# Patient Record
Sex: Male | Born: 1980
Health system: Southern US, Community
[De-identification: ages and names within clinical notes are randomized; demographics above are authoritative.]

## PROBLEM LIST (undated history)

## (undated) ENCOUNTER — Ambulatory Visit: Admission: EM

## (undated) DIAGNOSIS — I10 Essential (primary) hypertension: Secondary | ICD-10-CM

## (undated) HISTORY — PX: EYE SURGERY: SHX253

## (undated) HISTORY — PX: FINGER SURGERY: SHX640

---
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
## 2002-03-23 HISTORY — PX: FINGER SURGERY: SHX640

## 2017-09-20 ENCOUNTER — Ambulatory Visit
Admission: EM | Admit: 2017-09-20 | Discharge: 2017-09-20 | Disposition: A | Discharge status: Home / Self Care | Payer: Managed Care, Other (non HMO) | Attending: Family Medicine | Admitting: Family Medicine

## 2017-09-20 ENCOUNTER — Other Ambulatory Visit: Payer: Self-pay

## 2017-09-20 DIAGNOSIS — S39012A Strain of muscle, fascia and tendon of lower back, initial encounter: Secondary | ICD-10-CM | POA: Diagnosis not present

## 2017-09-20 MED ORDER — CYCLOBENZAPRINE HCL 10 MG PO TABS: 10.0000 mg | ORAL_TABLET | Freq: Every day | ORAL | 0 refills | Status: DC

## 2017-09-20 NOTE — ED Triage Notes (Signed)
Patient complains of lower back pain that started on Friday. Patient states that he recently moved and thought it may be from that. Patient states that pain originally started in groin and moved to his back. Patient states that pain is worse with lifting or walking.

## 2017-09-20 NOTE — ED Provider Notes (Signed)
[facility]    CSN: [id number] Arrival date & time: 09/20/17  1341     History   Chief Complaint Chief Complaint  Patient presents with  . Back Pain    HPI Leonard Lee is a 37 y.o. male.   The history is provided by the patient.  Back Pain  Location:  Lumbar spine Quality:  Aching Radiates to:  Does not radiate Pain severity:  Moderate Pain is:  Same all the time Onset quality:  Sudden Duration:  4 days Timing:  Constant Progression:  Unchanged Chronicity:  New Context: lifting heavy objects   Relieved by:  Nothing Ineffective treatments:  OTC medications Associated symptoms: no abdominal pain, no abdominal swelling, no bladder incontinence, no bowel incontinence, no chest pain, no dysuria, no fever, no headaches, no leg pain, no numbness, no paresthesias, no pelvic pain, no perianal numbness, no tingling, no weakness and no weight loss   Risk factors: no hx of cancer, no hx of osteoporosis, no recent surgery, no steroid use and no vascular disease     History reviewed. No pertinent past medical history.  There are no active problems to display for this patient.   Past Surgical History:  Procedure Laterality Date  . FINGER SURGERY         Home Medications    Prior to Admission medications   Medication Sig Start Date End Date Taking? Authorizing Provider  lisinopril (PRINIVIL,ZESTRIL) 20 MG tablet Take 20 mg by mouth daily.   Yes [provider]  cyclobenzaprine (FLEXERIL) 10 MG tablet Take 1 tablet (10 mg total) by mouth at bedtime. 09/20/17   Payton Mccallum, MD    Family History Family History  Problem Relation Age of Onset  . Heart disease Father     Social History Social History   Tobacco Use  . Smoking status: Current Some Day Smoker  . Smokeless tobacco: Never Used  Substance Use Topics  . Alcohol use: Not Currently  . Drug use: Not Currently     Allergies   Patient has no known allergies.   Review of  Systems Review of Systems  Constitutional: Negative for fever and weight loss.  Cardiovascular: Negative for chest pain.  Gastrointestinal: Negative for abdominal pain and bowel incontinence.  Genitourinary: Negative for bladder incontinence, dysuria and pelvic pain.  Musculoskeletal: Positive for back pain.  Neurological: Negative for tingling, weakness, numbness, headaches and paresthesias.     Physical Exam Triage Vital Signs ED Triage Vitals  Enc Vitals Group     BP 09/20/17 1401 (!) 147/98     Pulse Rate 09/20/17 1401 60     Resp 09/20/17 1401 18     Temp 09/20/17 1401 98.9 F (37.2 C)     Temp Source 09/20/17 1401 Oral     SpO2 09/20/17 1401 100 %     Weight 09/20/17 1359 300 lb (136.1 kg)     Height 09/20/17 1359 6\' 3"  (1.905 m)     Head Circumference --      Peak Flow --      Pain Score 09/20/17 1359 10     Pain Loc --      Pain Edu? --      Excl. in [place]? --    No data found.  Updated Vital Signs BP (!) 147/98 (BP Location: Right Arm)   Pulse 60   Temp 98.9 F (37.2 C) (Oral)   Resp 18   Ht 6\' 3"  (1.905 m)   Wt 300 lb (  136.1 kg)   SpO2 100%   BMI 37.50 kg/m   Visual Acuity Right Eye Distance:   Left Eye Distance:   Bilateral Distance:    Right Eye Near:   Left Eye Near:    Bilateral Near:     Physical Exam  Constitutional: He appears well-developed and well-nourished. No distress.  Neck: Normal range of motion. Neck supple. No tracheal deviation present.  Pulmonary/Chest: Effort normal. No stridor. No respiratory distress.  Musculoskeletal:       Lumbar back: He exhibits tenderness and spasm. He exhibits normal range of motion, no bony tenderness, no swelling, no edema, no deformity, no laceration, no pain and normal pulse.  Neurological: He is alert. He has normal reflexes. He displays normal reflexes. He exhibits normal muscle tone. Coordination normal.  Skin: No rash noted. He is not diaphoretic.  Nursing note and vitals reviewed.    UC  Treatments / Results  Labs (all labs ordered are listed, but only abnormal results are displayed) Labs Reviewed - No data to display  EKG None  Radiology No results found.  Procedures Procedures (including critical care time)  Medications Ordered in UC Medications - No data to display  Initial Impression / Assessment and Plan / UC Course  I have reviewed the triage vital signs and the nursing notes.  Pertinent labs & imaging results that were available during my care of the patient were reviewed by me and considered in my medical decision making (see chart for details).      Final Clinical Impressions(s) / UC Diagnoses   Final diagnoses:  Strain of lumbar region, initial encounter    ED Prescriptions    Medication Sig Dispense Auth. Provider   cyclobenzaprine (FLEXERIL) 10 MG tablet Take 1 tablet (10 mg total) by mouth at bedtime. 30 tablet Payton Mccallumonty, Clearnce Leja, MD     1. diagnosis reviewed with patient 2. rx as per orders above; reviewed possible side effects, interactions, risks and benefits  3. Recommend supportive treatment with heat/ice, stretching, otc analgesics prn 4. Follow-up prn if symptoms worsen or don't improve   Controlled Substance Prescriptions [place] Controlled Substance Registry consulted? Not Applicable   Payton Mccallumonty, Rayner Erman, MD 09/20/17 [phone]

## 2017-09-30 ENCOUNTER — Other Ambulatory Visit: Payer: Self-pay

## 2017-09-30 ENCOUNTER — Ambulatory Visit
Admission: EM | Admit: 2017-09-30 | Discharge: 2017-09-30 | Disposition: A | Discharge status: Home / Self Care | Payer: Managed Care, Other (non HMO) | Attending: Family Medicine | Admitting: Family Medicine

## 2017-09-30 DIAGNOSIS — S0501XA Injury of conjunctiva and corneal abrasion without foreign body, right eye, initial encounter: Secondary | ICD-10-CM | POA: Diagnosis not present

## 2017-09-30 MED ORDER — CIPROFLOXACIN HCL 0.3 % OP SOLN: 1.0000 [drp] | Freq: Four times a day (QID) | OPHTHALMIC | 0 refills | Status: AC
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
## 2017-09-30 NOTE — Discharge Instructions (Addendum)
Eye drop as prescribed.  No contacts over the weekend.  Take care  Dr. Adriana Simasook

## 2018-09-13 ENCOUNTER — Encounter: Payer: Self-pay | Admitting: Emergency Medicine

## 2018-09-13 ENCOUNTER — Other Ambulatory Visit: Payer: Self-pay

## 2018-09-13 ENCOUNTER — Ambulatory Visit
Admission: EM | Admit: 2018-09-13 | Discharge: 2018-09-13 | Disposition: A | Discharge status: Home / Self Care | Payer: 59 | Attending: Family Medicine | Admitting: Family Medicine

## 2018-09-13 DIAGNOSIS — S39012A Strain of muscle, fascia and tendon of lower back, initial encounter: Secondary | ICD-10-CM | POA: Diagnosis not present

## 2018-09-13 HISTORY — DX: Essential (primary) hypertension: I10

## 2018-09-13 MED ORDER — MELOXICAM 15 MG PO TABS: 15.0000 mg | ORAL_TABLET | Freq: Every day | ORAL | 0 refills | Status: DC | PRN

## 2018-09-13 MED ORDER — METAXALONE 800 MG PO TABS: 800.0000 mg | ORAL_TABLET | Freq: Three times a day (TID) | ORAL | 0 refills | Status: DC | PRN

## 2018-09-13 NOTE — ED Triage Notes (Signed)
Pt c/o lower back pain. Started yesterday and worsening. He states that he bent over to get something off the floor and heard a pop.

## 2018-09-13 NOTE — ED Provider Notes (Signed)
[facility]    CSN: [id number] Arrival date & time: 09/13/18  1646   History   Chief Complaint Chief Complaint  Patient presents with  . Back Pain   HPI  38 year old male presents with low back pain.  Started yesterday.  Patient reports that he bent over to pick something up and felt a pop in his low back.  He reports right low back pain.  States that it is severe.  Currently 7/10 in severity.  Worse with activity.  No relieving factors.  Patient states that he has missed work as a result.  Patient requesting a work note today.  No other reported symptoms.  No other complaints.  PMH, Surgical Hx, Family Hx, Social History reviewed and updated as below.  Past Medical History:  Diagnosis Date  . Hypertension   Morbid obesity  Past Surgical History:  Procedure Laterality Date  . FINGER SURGERY     Home Medications    Prior to Admission medications   Medication Sig Start Date End Date Taking? Authorizing Provider  lisinopril (PRINIVIL,ZESTRIL) 20 MG tablet Take 20 mg by mouth daily.   Yes [provider]  meloxicam (MOBIC) 15 MG tablet Take 1 tablet (15 mg total) by mouth daily as needed for pain. 09/13/18   Coral Spikes, DO  metaxalone (SKELAXIN) 800 MG tablet Take 1 tablet (800 mg total) by mouth 3 (three) times daily as needed for muscle spasms. 09/13/18   Coral Spikes, DO    Family History Family History  Problem Relation Age of Onset  . Heart disease Father     Social History Social History   Tobacco Use  . Smoking status: Current Some Day Smoker  . Smokeless tobacco: Never Used  Substance Use Topics  . Alcohol use: Not Currently  . Drug use: Not Currently     Allergies   Patient has no known allergies.   Review of Systems Review of Systems  Constitutional: Negative.   Musculoskeletal: Positive for back pain.   Physical Exam Triage Vital Signs ED Triage Vitals  Enc Vitals Group     BP 09/13/18 1700 (!) 157/100     Pulse Rate  09/13/18 1700 71     Resp 09/13/18 1700 18     Temp 09/13/18 1700 98.9 F (37.2 C)     Temp Source 09/13/18 1700 Oral     SpO2 09/13/18 1700 99 %     Weight 09/13/18 1657 (!) 340 lb (154.2 kg)     Height 09/13/18 1657 6\' 3"  (1.905 m)     Head Circumference --      Peak Flow --      Pain Score 09/13/18 1657 7     Pain Loc --      Pain Edu? --      Excl. in [place]? --    Updated Vital Signs BP (!) 157/100 (BP Location: Left Arm)   Pulse 71   Temp 98.9 F (37.2 C) (Oral)   Resp 18   Ht 6\' 3"  (1.905 m)   Wt (!) 154.2 kg   SpO2 99%   BMI 42.50 kg/m   Visual Acuity Right Eye Distance:   Left Eye Distance:   Bilateral Distance:    Right Eye Near:   Left Eye Near:    Bilateral Near:     Physical Exam Vitals signs and nursing note reviewed.  Constitutional:      General: He is not in acute distress.    Appearance:  Normal appearance.  HENT:     Head: Normocephalic and atraumatic.  Eyes:     General:        Right eye: No discharge.        Left eye: No discharge.     Conjunctiva/sclera: Conjunctivae normal.  Cardiovascular:     Rate and Rhythm: Normal rate and regular rhythm.  Pulmonary:     Effort: Pulmonary effort is normal.     Breath sounds: Normal breath sounds.  Musculoskeletal:     Comments: Low back - decreased ROM due to pain. Negative straight leg raise.  Neurological:     Mental Status: He is alert.  Psychiatric:        Mood and Affect: Mood normal.        Behavior: Behavior normal.    UC Treatments / Results  Labs (all labs ordered are listed, but only abnormal results are displayed) Labs Reviewed - No data to display  EKG None  Radiology No results found.  Procedures Procedures (including critical care time)  Medications Ordered in UC Medications - No data to display  Initial Impression / Assessment and Plan / UC Course  I have reviewed the triage vital signs and the nursing notes.  Pertinent labs & imaging results that were available  during my care of the patient were reviewed by me and considered in my medical decision making (see chart for details).    38 year old male presents with a lumbar strain.  Meloxicam and Skelaxin as directed.  Work note given.  Final Clinical Impressions(s) / UC Diagnoses   Final diagnoses:  Strain of lumbar region, initial encounter     Discharge Instructions     Rest. Ice and heat as needed.  Medications as prescribed.  Take care  Dr. Adriana Simasook    ED Prescriptions    Medication Sig Dispense Auth. Provider   meloxicam (MOBIC) 15 MG tablet Take 1 tablet (15 mg total) by mouth daily as needed for pain. 30 tablet Tiesha Marich G, DO   metaxalone (SKELAXIN) 800 MG tablet Take 1 tablet (800 mg total) by mouth 3 (three) times daily as needed for muscle spasms. 30 tablet Tommie Samsook, Lynnsie Linders G, DO     Controlled Substance Prescriptions [place] Controlled Substance Registry consulted? Not Applicable   Tommie SamsCook, Alina Gilkey G, DO 09/13/18 1735

## 2018-09-13 NOTE — Discharge Instructions (Signed)
Rest. Ice and heat as needed.  Medications as prescribed.  Take care  Dr. Lacinda Axon

## 2018-12-05 ENCOUNTER — Encounter: Payer: Self-pay | Admitting: Emergency Medicine

## 2018-12-05 ENCOUNTER — Other Ambulatory Visit: Payer: Self-pay

## 2018-12-05 ENCOUNTER — Ambulatory Visit
Admission: EM | Admit: 2018-12-05 | Discharge: 2018-12-05 | Disposition: A | Discharge status: Home / Self Care | Payer: 59 | Attending: Family Medicine | Admitting: Family Medicine

## 2018-12-05 DIAGNOSIS — H6501 Acute serous otitis media, right ear: Secondary | ICD-10-CM | POA: Diagnosis not present

## 2018-12-05 DIAGNOSIS — J029 Acute pharyngitis, unspecified: Secondary | ICD-10-CM

## 2018-12-05 MED ORDER — AMOXICILLIN 875 MG PO TABS: 875.0000 mg | ORAL_TABLET | Freq: Two times a day (BID) | ORAL | 0 refills | Status: DC

## 2018-12-05 NOTE — Discharge Instructions (Addendum)
Tylenol, advil as needed Salt water gargles

## 2018-12-05 NOTE — ED Provider Notes (Signed)
[facility]    CSN: [id number] Arrival date & time: 12/05/18  1604      History   Chief Complaint Chief Complaint  Patient presents with  . Otalgia    HPI Leonard Lee is a 38 y.o. male.   38 yo male with a c/o right ear pain, pressure and intermittent ringing since yesterday along with sore throat. Denies any congestion, cough, fevers, chills, drainage.    Otalgia   Past Medical History:  Diagnosis Date  . Hypertension     There are no active problems to display for this patient.   Past Surgical History:  Procedure Laterality Date  . FINGER SURGERY         Home Medications    Prior to Admission medications   Medication Sig Start Date End Date Taking? Authorizing Provider  lisinopril (PRINIVIL,ZESTRIL) 20 MG tablet Take 20 mg by mouth daily.   Yes [provider]  amoxicillin (AMOXIL) 875 MG tablet Take 1 tablet (875 mg total) by mouth 2 (two) times daily. 12/05/18   Norval Gable, MD  meloxicam (MOBIC) 15 MG tablet Take 1 tablet (15 mg total) by mouth daily as needed for pain. 09/13/18   Coral Spikes, DO  metaxalone (SKELAXIN) 800 MG tablet Take 1 tablet (800 mg total) by mouth 3 (three) times daily as needed for muscle spasms. 09/13/18   Coral Spikes, DO    Family History Family History  Problem Relation Age of Onset  . Heart disease Father     Social History Social History   Tobacco Use  . Smoking status: Current Some Day Smoker  . Smokeless tobacco: Never Used  Substance Use Topics  . Alcohol use: Not Currently  . Drug use: Not Currently     Allergies   Patient has no known allergies.   Review of Systems Review of Systems  HENT: Positive for ear pain.      Physical Exam Triage Vital Signs ED Triage Vitals  Enc Vitals Group     BP 12/05/18 1621 (!) 176/113     Pulse Rate 12/05/18 1621 91     Resp 12/05/18 1621 19     Temp 12/05/18 1621 99.1 F (37.3 C)     Temp Source 12/05/18 1621 Oral     SpO2 12/05/18  1621 98 %     Weight 12/05/18 1623 (!) 330 lb (149.7 kg)     Height 12/05/18 1623 6\' 3"  (1.905 m)     Head Circumference --      Peak Flow --      Pain Score 12/05/18 1623 7     Pain Loc --      Pain Edu? --      Excl. in [place]? --    No data found.  Updated Vital Signs BP (!) 176/113 (BP Location: Left Arm)   Pulse 91   Temp 99.1 F (37.3 C) (Oral)   Resp 19   Ht 6\' 3"  (1.905 m)   Wt (!) 149.7 kg   SpO2 98%   BMI 41.25 kg/m   Visual Acuity Right Eye Distance:   Left Eye Distance:   Bilateral Distance:    Right Eye Near:   Left Eye Near:    Bilateral Near:     Physical Exam Vitals signs and nursing note reviewed.  Constitutional:      General: He is not in acute distress.    Appearance: He is not toxic-appearing or diaphoretic.  HENT:  Right Ear: A middle ear effusion is present. Tympanic membrane is erythematous and bulging.     Left Ear: Tympanic membrane normal.     Nose: Nose normal.     Mouth/Throat:     Pharynx: Uvula midline. Posterior oropharyngeal erythema present. No oropharyngeal exudate or uvula swelling.     Tonsils: No tonsillar exudate or tonsillar abscesses.  Cardiovascular:     Rate and Rhythm: Normal rate.  Neurological:     Mental Status: He is alert.      UC Treatments / Results  Labs (all labs ordered are listed, but only abnormal results are displayed) Labs Reviewed - No data to display  EKG   Radiology No results found.  Procedures Procedures (including critical care time)  Medications Ordered in UC Medications - No data to display  Initial Impression / Assessment and Plan / UC Course  I have reviewed the triage vital signs and the nursing notes.  Pertinent labs & imaging results that were available during my care of the patient were reviewed by me and considered in my medical decision making (see chart for details).      Final Clinical Impressions(s) / UC Diagnoses   Final diagnoses:  Right acute serous otitis  media, recurrence not specified  Sore throat     Discharge Instructions     Tylenol, advil as needed Salt water gargles    ED Prescriptions    Medication Sig Dispense Auth. Provider   amoxicillin (AMOXIL) 875 MG tablet Take 1 tablet (875 mg total) by mouth 2 (two) times daily. 20 tablet Payton Mccallumonty, Laberta Wilbon, MD      1. diagnosis reviewed with patient 2. rx as per orders above; reviewed possible side effects, interactions, risks and benefits  3. Recommend supportive treatment as above 4. Follow-up prn if symptoms worsen or don't improve   Controlled Substance Prescriptions [place] Controlled Substance Registry consulted? Not Applicable   Payton Mccallumonty, Jaxton Casale, MD 12/05/18 1820

## 2018-12-05 NOTE — ED Triage Notes (Signed)
Patient states he developed a ringing and pain in his right ear

## 2019-03-07 ENCOUNTER — Ambulatory Visit
Admission: EM | Admit: 2019-03-07 | Discharge: 2019-03-07 | Disposition: A | Discharge status: Home / Self Care | Payer: 59 | Attending: Family Medicine | Admitting: Family Medicine

## 2019-03-07 ENCOUNTER — Encounter: Payer: Self-pay | Admitting: Emergency Medicine

## 2019-03-07 ENCOUNTER — Other Ambulatory Visit: Payer: Self-pay

## 2019-03-07 DIAGNOSIS — R197 Diarrhea, unspecified: Secondary | ICD-10-CM

## 2019-03-07 MED ORDER — ONDANSETRON HCL 4 MG PO TABS: 4.0000 mg | ORAL_TABLET | Freq: Three times a day (TID) | ORAL | 0 refills | Status: DC | PRN

## 2019-03-07 MED ORDER — DIPHENOXYLATE-ATROPINE 2.5-0.025 MG PO TABS: 1.0000 | ORAL_TABLET | Freq: Four times a day (QID) | ORAL | 0 refills | Status: DC | PRN

## 2019-03-07 NOTE — ED Triage Notes (Addendum)
Patient c/o diarrhea that started 5 days ago. He states he started taking Pepto Bismol 2 days ago which then caused abdominal pain. He states he vomited x 1 5 days ago.  Patient requiring COVID testing per work.

## 2019-03-07 NOTE — ED Provider Notes (Signed)
[facility]    CSN: [id number] Arrival date & time: 03/07/19  1114  History   Chief Complaint Chief Complaint  Patient presents with  . Diarrhea  . Abdominal Pain   HPI  38 year old male presents with diarrhea, abdominal pain, nausea.  Patient reports his symptoms started on Friday.  He reports ongoing diarrhea.  Has now developed sharp abdominal pains.  Had one episode of emesis.  Has not had any since.  Reports ongoing nausea.  He is tolerating fluids.  He is tolerating some food.  He is taken some Pepto-Bismol without resolution.  No fever.  No urinary symptoms.  Patient states that he notified his employer and he was encouraged to come in for evaluation and to get a Covid test.  No other associated symptoms.  No other complaints or concerns at this time.  PMH, Surgical Hx, Family Hx, Social History reviewed and updated as below. Past Medical History:  Diagnosis Date  . Hypertension   Morbid obesity  Past Surgical History:  Procedure Laterality Date  . FINGER SURGERY      Home Medications    Prior to Admission medications   Medication Sig Start Date End Date Taking? Authorizing Provider  lisinopril (PRINIVIL,ZESTRIL) 20 MG tablet Take 20 mg by mouth daily.   Yes [provider]  lisinopril-hydrochlorothiazide (ZESTORETIC) 20-12.5 MG tablet lisinopril 20 mg-hydrochlorothiazide 12.5 mg tablet   Yes [provider]  amoxicillin (AMOXIL) 875 MG tablet Take 1 tablet (875 mg total) by mouth 2 (two) times daily. 12/05/18   Norval Gable, MD  diphenoxylate-atropine (LOMOTIL) 2.5-0.025 MG tablet Take 1 tablet by mouth 4 (four) times daily as needed for diarrhea or loose stools. 03/07/19   Coral Spikes, DO  ondansetron (ZOFRAN) 4 MG tablet Take 1 tablet (4 mg total) by mouth every 8 (eight) hours as needed for nausea or vomiting. 03/07/19   Coral Spikes, DO    Family History Family History  Problem Relation Age of Onset  . Heart disease Father      Social History Social History   Tobacco Use  . Smoking status: Current Some Day Smoker  . Smokeless tobacco: Never Used  Substance Use Topics  . Alcohol use: Never  . Drug use: Not Currently     Allergies   Patient has no known allergies.   Review of Systems Review of Systems  Constitutional: Positive for appetite change. Negative for fever.  Gastrointestinal: Positive for diarrhea, nausea and vomiting.   Physical Exam Triage Vital Signs ED Triage Vitals  Enc Vitals Group     BP 03/07/19 1133 (!) 161/76     Pulse Rate 03/07/19 1133 77     Resp 03/07/19 1133 18     Temp 03/07/19 1133 98.6 F (37 C)     Temp Source 03/07/19 1133 Oral     SpO2 03/07/19 1133 99 %     Weight 03/07/19 1128 (!) 340 lb (154.2 kg)     Height 03/07/19 1128 6\' 3"  (1.905 m)     Head Circumference --      Peak Flow --      Pain Score 03/07/19 1128 5     Pain Loc --      Pain Edu? --      Excl. in [place]? --    Updated Vital Signs BP (!) 161/76 (BP Location: Right Arm)   Pulse 77   Temp 98.6 F (37 C) (Oral)   Resp 18   Ht  6\' 3"  (1.905 m)   Wt (!) 154.2 kg   SpO2 99%   BMI 42.50 kg/m   Visual Acuity Right Eye Distance:   Left Eye Distance:   Bilateral Distance:    Right Eye Near:   Left Eye Near:    Bilateral Near:     Physical Exam Vitals and nursing note reviewed.  Constitutional:      General: He is not in acute distress.    Appearance: Normal appearance. He is obese. He is not ill-appearing.  HENT:     Head: Normocephalic and atraumatic.  Eyes:     General:        Right eye: No discharge.        Left eye: No discharge.     Conjunctiva/sclera: Conjunctivae normal.  Cardiovascular:     Rate and Rhythm: Normal rate and regular rhythm.     Heart sounds: No murmur.  Pulmonary:     Effort: Pulmonary effort is normal.     Breath sounds: Normal breath sounds. No wheezing, rhonchi or rales.  Abdominal:     General: There is no distension.     Palpations: Abdomen is  soft.     Tenderness: There is no abdominal tenderness.  Neurological:     Mental Status: He is alert.  Psychiatric:        Mood and Affect: Mood normal.        Behavior: Behavior normal.    UC Treatments / Results  Labs (all labs ordered are listed, but only abnormal results are displayed) Labs Reviewed  NOVEL CORONAVIRUS, NAA (HOSP ORDER, SEND-OUT TO REF LAB; TAT 18-24 HRS)    EKG   Radiology No results found.  Procedures Procedures (including critical care time)  Medications Ordered in UC Medications - No data to display  Initial Impression / Assessment and Plan / UC Course  I have reviewed the triage vital signs and the nursing notes.  Pertinent labs & imaging results that were available during my care of the patient were reviewed by me and considered in my medical decision making (see chart for details).    38 year old male presents with nausea and diarrhea.  No recent vomiting.  Is well-appearing on exam.  Awaiting Covid test results.  Zofran and Lomotil as prescribed.  Final Clinical Impressions(s) / UC Diagnoses   Final diagnoses:  Diarrhea, unspecified type     Discharge Instructions     Fluids.  Medications as directed.  Take care  Dr. 24    ED Prescriptions    Medication Sig Dispense Auth. Provider   ondansetron (ZOFRAN) 4 MG tablet Take 1 tablet (4 mg total) by mouth every 8 (eight) hours as needed for nausea or vomiting. 20 tablet Crystalynn Mcinerney G, DO   diphenoxylate-atropine (LOMOTIL) 2.5-0.025 MG tablet Take 1 tablet by mouth 4 (four) times daily as needed for diarrhea or loose stools. 30 tablet 06-[birth year]-1989, DO     PDMP not reviewed this encounter.   Tommie Sams, DO 03/07/19 1324

## 2019-03-07 NOTE — Discharge Instructions (Signed)
Fluids.  Medications as directed.  Take care  Dr. Lacinda Axon

## 2019-03-08 LAB — NOVEL CORONAVIRUS, NAA (HOSP ORDER, SEND-OUT TO REF LAB; TAT 18-24 HRS): SARS-CoV-2, NAA: NOT DETECTED

## 2019-04-17 ENCOUNTER — Ambulatory Visit
Admission: EM | Admit: 2019-04-17 | Discharge: 2019-04-17 | Disposition: A | Discharge status: Home / Self Care | Payer: 59 | Attending: Family Medicine | Admitting: Family Medicine

## 2019-04-17 ENCOUNTER — Other Ambulatory Visit: Payer: Self-pay

## 2019-04-17 DIAGNOSIS — Z7189 Other specified counseling: Secondary | ICD-10-CM | POA: Diagnosis present

## 2019-04-17 DIAGNOSIS — R0981 Nasal congestion: Secondary | ICD-10-CM | POA: Insufficient documentation

## 2019-04-17 DIAGNOSIS — R519 Headache, unspecified: Secondary | ICD-10-CM | POA: Diagnosis not present

## 2019-04-17 DIAGNOSIS — Z20822 Contact with and (suspected) exposure to covid-19: Secondary | ICD-10-CM | POA: Diagnosis present

## 2019-04-17 NOTE — ED Provider Notes (Signed)
[facility] ____________________________________________  Time seen: Approximately 5:28 PM  I have reviewed the triage vital signs and the nursing notes.   HISTORY  Chief Complaint covid exposure   HPI Leonard Lee is a 39 y.o. male presenting for evaluation of nasal congestion and headache. Reports congestion and headache started today. 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  States he was last around these coworkers this past Friday.  States he overall feels well, but reports he is trying to be proactive as his wife is a diabetic.  Denies sore throat, chest pain, shortness of breath, vision changes, fevers, changes in taste or smell, vomiting or diarrhea.  Has not taken any of the counter medication today for same complaints.  Has continued to eat and drink well.  Denies other aggravating alleviating factors.  Patient reports his blood pressure is normally elevated in the doctor's office, has been taking his medication as prescribed.   Past Medical History:  Diagnosis Date  . Hypertension     There are no problems to display for this patient.   Past Surgical History:  Procedure Laterality Date  . EYE SURGERY    . FINGER SURGERY       No current facility-administered medications for this encounter.  Current Outpatient Medications:  .  lisinopril-hydrochlorothiazide (ZESTORETIC) 20-12.5 MG tablet, lisinopril 20 mg-hydrochlorothiazide 12.5 mg tablet, Disp: , Rfl:   Allergies Patient has no known allergies.  Family History  Problem Relation Age of Onset  . Heart disease Father     Social History Social History   Tobacco Use  . Smoking status: Current Some Day Smoker    Types: Cigarettes  . Smokeless tobacco: Never Used  Substance Use Topics  . Alcohol use: Never  . Drug use: Not Currently    Review of Systems Constitutional: No fever Eyes: No visual  changes. ENT: No sore throat.  Positive congestion. Cardiovascular: Denies chest pain. Respiratory: Denies shortness of breath. Gastrointestinal: No abdominal pain.  No nausea, no vomiting.  No diarrhea.  Genitourinary: Negative for dysuria. Musculoskeletal: Negative for back pain. Skin: Negative for rash. Neurological: Negative for numbness.   ____________________________________________   PHYSICAL EXAM:  VITAL SIGNS: ED Triage Vitals  Enc Vitals Group     BP 04/17/19 1648 (!) 175/114     Pulse Rate 04/17/19 1648 95     Resp 04/17/19 1648 20     Temp 04/17/19 1648 98.4 F (36.9 C)     Temp Source 04/17/19 1648 Oral     SpO2 04/17/19 1648 99 %     Weight 04/17/19 1651 (!) 340 lb (154.2 kg)     Height 04/17/19 1651 6\' 3"  (1.905 m)     Head Circumference --      Peak Flow --      Pain Score 04/17/19 1651 3     Pain Loc --      Pain Edu? --      Excl. in [place]? --    Vitals:   04/17/19 1648 04/17/19 1651 04/17/19 1712  BP: (!) 175/114  (!) 157/109  Pulse: 95    Resp: 20    Temp: 98.4 F (36.9 C)    TempSrc: Oral    SpO2: 99%    Weight:  (!) 340 lb (154.2 kg)   Height:  6\' 3"  (1.905 m)      Constitutional: Alert and oriented. Well appearing and in  no acute distress. Eyes: Conjunctivae are normal. PERRL. EOMI. ENT      Head: Normocephalic and atraumatic. Hematological/Lymphatic/Immunilogical: No cervical lymphadenopathy. Cardiovascular: Normal rate, regular rhythm. Grossly normal heart sounds.  Good peripheral circulation. Respiratory: Normal respiratory effort without tachypnea nor retractions. Breath sounds are clear and equal bilaterally. No wheezes, rales, rhonchi. Musculoskeletal: Steady gait.  No lower extremity edema noted bilaterally. Neurologic:  Normal speech and language. No gross focal neurologic deficits are appreciated. Speech is normal. No gait instability.  Skin:  Skin is warm, dry and intact. No rash noted. Psychiatric: Mood and affect are normal.  Speech and behavior are normal. Patient exhibits appropriate insight and judgment   ___________________________________________   LABS (all labs ordered are listed, but only abnormal results are displayed)  Labs Reviewed  NOVEL CORONAVIRUS, NAA (HOSP ORDER, SEND-OUT TO REF LAB; TAT 18-24 HRS)   ____________________________________________   PROCEDURES   INITIAL IMPRESSION / ASSESSMENT AND PLAN / ED COURSE  Pertinent labs & imaging results that were available during my care of the patient were reviewed by me and considered in my medical decision making (see chart for details).  Well-appearing patient.  No acute distress.  Suspect viral illness.  COVID-19 exposure.  COVID-19 testing completed and advice given.  Monitor rest, fluids, work note given.  Continue to monitor blood pressure, and patient verbalized understanding, and follow-up with his primary care.  Discussed indication, risks and benefits of medications with patient.  Discussed follow up and return parameters including no resolution or any worsening concerns. Patient verbalized understanding and agreed to plan.   ____________________________________________   FINAL CLINICAL IMPRESSION(S) / ED DIAGNOSES  Final diagnoses:  Advice given about COVID-19 virus infection  Exposure to COVID-19 virus  Nasal congestion     ED Discharge Orders    None       Note: This dictation was prepared with Dragon dictation along with smaller phrase technology. Any transcriptional errors that result from this process are unintentional.         Marylene Land, NP 04/17/19 1739

## 2019-04-17 NOTE — ED Triage Notes (Signed)
Pt states he was exposed to COVID at his job last week with two people there testing positive for COVID over the weekend. Today has congestion headache.

## 2019-04-17 NOTE — Discharge Instructions (Signed)
Over the counter medication as needed. Monitor. Rest. Drink plenty of fluids.   Monitor blood pressure.   Follow up with your primary care physician this week as needed. Return to Urgent care for new or worsening concerns.

## 2019-04-18 LAB — NOVEL CORONAVIRUS, NAA (HOSP ORDER, SEND-OUT TO REF LAB; TAT 18-24 HRS): SARS-CoV-2, NAA: NOT DETECTED

## 2019-09-06 ENCOUNTER — Ambulatory Visit
Admission: EM | Admit: 2019-09-06 | Discharge: 2019-09-06 | Disposition: A | Discharge status: Home / Self Care | Payer: 59 | Attending: Internal Medicine | Admitting: Internal Medicine

## 2019-09-06 ENCOUNTER — Other Ambulatory Visit: Payer: Self-pay

## 2019-09-06 ENCOUNTER — Ambulatory Visit (INDEPENDENT_AMBULATORY_CARE_PROVIDER_SITE_OTHER): Payer: 59

## 2019-09-06 DIAGNOSIS — R0602 Shortness of breath: Secondary | ICD-10-CM | POA: Diagnosis not present

## 2019-09-06 DIAGNOSIS — I10 Essential (primary) hypertension: Secondary | ICD-10-CM | POA: Insufficient documentation

## 2019-09-06 DIAGNOSIS — Z79899 Other long term (current) drug therapy: Secondary | ICD-10-CM | POA: Insufficient documentation

## 2019-09-06 DIAGNOSIS — R05 Cough: Secondary | ICD-10-CM | POA: Insufficient documentation

## 2019-09-06 DIAGNOSIS — Z20828 Contact with and (suspected) exposure to other viral communicable diseases: Secondary | ICD-10-CM | POA: Diagnosis not present

## 2019-09-06 DIAGNOSIS — R531 Weakness: Secondary | ICD-10-CM | POA: Insufficient documentation

## 2019-09-06 DIAGNOSIS — F17210 Nicotine dependence, cigarettes, uncomplicated: Secondary | ICD-10-CM | POA: Insufficient documentation

## 2019-09-06 DIAGNOSIS — J111 Influenza due to unidentified influenza virus with other respiratory manifestations: Secondary | ICD-10-CM | POA: Diagnosis not present

## 2019-09-06 DIAGNOSIS — R509 Fever, unspecified: Secondary | ICD-10-CM | POA: Insufficient documentation

## 2019-09-06 DIAGNOSIS — Z20822 Contact with and (suspected) exposure to covid-19: Secondary | ICD-10-CM | POA: Insufficient documentation

## 2019-09-06 LAB — INFLUENZA PANEL BY PCR (TYPE A & B)
Influenza A By PCR: NEGATIVE
Influenza B By PCR: NEGATIVE

## 2019-09-06 MED ORDER — ALBUTEROL SULFATE HFA 108 (90 BASE) MCG/ACT IN AERS: 2.0000 | INHALATION_SPRAY | RESPIRATORY_TRACT | 0 refills | Status: DC | PRN

## 2019-09-06 MED ORDER — AZITHROMYCIN 250 MG PO TABS: ORAL_TABLET | ORAL | 0 refills | Status: DC

## 2019-09-06 MED ORDER — ACETAMINOPHEN 500 MG PO TABS
1000.0000 mg | ORAL_TABLET | Freq: Once | ORAL | Status: AC
  Administered 2019-09-06: 1000 mg via ORAL

## 2019-09-06 NOTE — Discharge Instructions (Addendum)
Your flu test is negative and you may have COVID. The chest xray is normal.  For fever and body aches take Tylenol 1000 mg every 6 hour and you may add Ibuprofen 800 mg between dose doses every 8 hours.  Push plenty of fluids to keep you hydrated. Do not go to work and stay quarantined until your Covid test is back. You may also drink organic beef or chicken broth to help you hydrate and help with this virus.   Take the following supplements to help your immune system be stronger to fight this viral infection Take Quarcetin 500 mg three times a day x 7 days with Zinc 50 mg ones a day x 7 days. The quarcetin is an antiviral and anti-inflammatory supplement which helps open the zinc channels in the cell to absorb Zinc. Zinc helps decrease the virus load in your body.  Also make sure to take Vit D 5,000 IU per day with a fatty meal and Vit C 1000 mg a day until you are completely better. Stay on Vitamin D 2,000 daily the rest of the season.

## 2019-09-06 NOTE — ED Triage Notes (Signed)
Cough congestion and fatigue x 2 days, unknown sick contacts, not taking anything OTC to help.

## 2019-09-06 NOTE — ED Provider Notes (Signed)
[facility]    CSN: [id number] Arrival date & time: 09/06/19  1835      History   Chief Complaint Chief Complaint  Patient presents with  . Cough    HPI Leonard Lee is a 39 y.o. male. who presents with cough, nose congestion 2 days ago and onset of diarrhea since yesterday. Has not been nauseous but has not vomited. Has severe HA and body aches.  Has severe body aches today and feel weak. Has been trying to hydrate, and he worked all day.    Past Medical History:  Diagnosis Date  . Hypertension     There are no problems to display for this patient.   Past Surgical History:  Procedure Laterality Date  . EYE SURGERY    . FINGER SURGERY         Home Medications    Prior to Admission medications   Medication Sig Start Date End Date Taking? Authorizing Provider  albuterol (VENTOLIN HFA) 108 (90 Base) MCG/ACT inhaler Inhale 2 puffs into the lungs every 4 (four) hours as needed for up to 7 days for wheezing or shortness of breath. For cough and shortness of breath 09/06/19 09/13/19  Rodriguez-Southworth, Nettie Elm, PA-C  azithromycin (ZITHROMAX Z-PAK) 250 MG tablet Take 2 today, then 1 qd x 4 days 09/06/19   Rodriguez-Southworth, Nettie Elm, PA-C  lisinopril-hydrochlorothiazide (ZESTORETIC) 20-12.5 MG tablet lisinopril 20 mg-hydrochlorothiazide 12.5 mg tablet    [provider]  lisinopril (PRINIVIL,ZESTRIL) 20 MG tablet Take 20 mg by mouth daily.  04/17/19  [provider]    Family History Family History  Problem Relation Age of Onset  . Heart disease Father     Social History Social History   Tobacco Use  . Smoking status: Current Some Day Smoker    Types: Cigarettes  . Smokeless tobacco: Never Used  Vaping Use  . Vaping Use: Former  Substance Use Topics  . Alcohol use: Never  . Drug use: Not Currently     Allergies   Patient has no known allergies.   Review of Systems Review of Systems  Constitutional: Positive for appetite  change, chills, fatigue and fever.  HENT: Positive for congestion. Negative for ear discharge, ear pain, postnasal drip, rhinorrhea, sinus pressure, sore throat and trouble swallowing.   Eyes: Negative for discharge.  Respiratory: Positive for cough and shortness of breath. Negative for chest tightness and wheezing.        His chest is sore when he coughs or lifts something heavy  Cardiovascular: Negative for chest pain and leg swelling.  Gastrointestinal: Negative for abdominal pain, diarrhea, nausea and vomiting.  Musculoskeletal: Positive for arthralgias and myalgias. Negative for gait problem, neck pain and neck stiffness.  Skin: Negative for rash.  Neurological: Positive for weakness and headaches.  Hematological: Negative for adenopathy.   Physical Exam Triage Vital Signs ED Triage Vitals [09/06/19 1954]  Enc Vitals Group     BP (!) 162/82     Pulse Rate (!) 102     Resp 16     Temp 99.2 F (37.3 C)     Temp src      SpO2 94 %     Weight      Height      Head Circumference      Peak Flow      Pain Score 10     Pain Loc      Pain Edu?      Excl. in [place]?    No  data found.  Updated Vital Signs BP (!) 162/82   Pulse (!) 102   Temp 99.2 F (37.3 C)   Resp 16   SpO2 94%   Visual Acuity Right Eye Distance:   Left Eye Distance:   Bilateral Distance:    Right Eye Near:   Left Eye Near:    Bilateral Near:     Physical Exam Vitals and nursing note reviewed.  Constitutional:      Appearance: He is obese. He is ill-appearing.  HENT:     Head: Atraumatic.     Right Ear: Tympanic membrane, ear canal and external ear normal.     Left Ear: Tympanic membrane, ear canal and external ear normal.     Nose: Congestion present. No rhinorrhea.     Mouth/Throat:     Mouth: Mucous membranes are moist.     Pharynx: Oropharynx is clear.  Eyes:     General: No scleral icterus.    Conjunctiva/sclera: Conjunctivae normal.  Cardiovascular:     Rate and Rhythm: Normal rate and  regular rhythm.     Pulses: Normal pulses.     Heart sounds: No murmur heard.   Pulmonary:     Breath sounds: No wheezing, rhonchi or rales.     Comments: He has been breathing heavy but is not tachypnic Abdominal:     General: Abdomen is flat. Bowel sounds are normal.     Palpations: Abdomen is soft.     Tenderness: There is no abdominal tenderness.  Musculoskeletal:        General: Normal range of motion.     Cervical back: Neck supple. No rigidity or tenderness.  Lymphadenopathy:     Cervical: No cervical adenopathy.  Skin:    General: Skin is warm and dry.     Findings: No rash.  Neurological:     Mental Status: He is oriented to person, place, and time.     Motor: Weakness present.     Gait: Gait normal.     Comments: Per the xray tech pt could hardly stay on his feet to do the chest xray  Psychiatric:        Mood and Affect: Mood normal.        Behavior: Behavior normal.        Thought Content: Thought content normal.        Judgment: Judgment normal.      UC Treatments / Results  Labs (all labs ordered are listed, but only abnormal results are displayed) Labs Reviewed  SARS CORONAVIRUS 2 (TAT 6-24 HRS)  INFLUENZA PANEL BY PCR (TYPE A & B)    EKG   Radiology DG Chest 2 View  Result Date: 09/06/2019 CLINICAL DATA:  Cough, fever EXAM: CHEST - 2 VIEW COMPARISON:  None. FINDINGS: The heart size and mediastinal contours are within normal limits. Both lungs are clear. The visualized skeletal structures are unremarkable. IMPRESSION: No active cardiopulmonary disease. Electronically Signed   By: Charlett Nose M.D.   On: 09/06/2019 21:01    Procedures Procedures (including critical care time)  Medications Ordered in UC Medications  acetaminophen (TYLENOL) tablet 1,000 mg (1,000 mg Oral Given 09/06/19 2038)    Initial Impression / Assessment and Plan / UC Course  I have reviewed the triage vital signs and the nursing notes. I strongly suspect he has covid and  educated him about this and needs to be quarantined until his result is back.  Pertinent labs & imaging results that were available  during my care of the patient were reviewed by me and considered in my medical decision making (see chart for details). I placed him on Zpack to cover bacterial bronchitis and placed him on albuterol inhaler. His discharge pulse ox was 96% and repeated temp was 99.9 F oral Final Clinical Impressions(s) / UC Diagnoses   Final diagnoses:  Influenza-like illness     Discharge Instructions     Your flu test is negative and you may have COVID. The chest xray is normal.  For fever and body aches take Tylenol 1000 mg every 6 hour and you may add Ibuprofen 800 mg between dose doses every 8 hours.  Push plenty of fluids to keep you hydrated. Do not go to work and stay quarantined until your Covid test is back. You may also drink organic beef or chicken broth to help you hydrate and help with this virus.   Take the following supplements to help your immune system be stronger to fight this viral infection Take Quarcetin 500 mg three times a day x 7 days with Zinc 50 mg ones a day x 7 days. The quarcetin is an antiviral and anti-inflammatory supplement which helps open the zinc channels in the cell to absorb Zinc. Zinc helps decrease the virus load in your body.  Also make sure to take Vit D 5,000 IU per day with a fatty meal and Vit C 1000 mg a day until you are completely better. Stay on Vitamin D 2,000 daily the rest of the season.      ED Prescriptions    Medication Sig Dispense Auth. Provider   albuterol (VENTOLIN HFA) 108 (90 Base) MCG/ACT inhaler Inhale 2 puffs into the lungs every 4 (four) hours as needed for up to 7 days for wheezing or shortness of breath. For cough and shortness of breath 18 g Rodriguez-Southworth, Heston Widener, PA-C   azithromycin (ZITHROMAX Z-PAK) 250 MG tablet Take 2 today, then 1 qd x 4 days 6 each Rodriguez-Southworth, Sunday Spillers, PA-C     PDMP not  reviewed this encounter.   Shelby Mattocks, Hershal Coria 09/06/19 2144

## 2019-09-07 ENCOUNTER — Telehealth: Payer: Self-pay | Admitting: Emergency Medicine

## 2019-09-07 LAB — SARS CORONAVIRUS 2 (TAT 6-24 HRS): SARS Coronavirus 2: NEGATIVE

## 2019-09-07 MED ORDER — AZITHROMYCIN 250 MG PO TABS: ORAL_TABLET | ORAL | 0 refills | Status: DC

## 2019-09-07 MED ORDER — ALBUTEROL SULFATE HFA 108 (90 BASE) MCG/ACT IN AERS: 2.0000 | INHALATION_SPRAY | RESPIRATORY_TRACT | 0 refills | Status: DC | PRN

## 2019-09-07 NOTE — Telephone Encounter (Signed)
Pt calling stating medications had not been called into the pharmacy. Medications were sent to [facility] [place] and need to be sent to [facility]. Cancelled rx's at [facility] and resent in medications to [facility] [place].

## 2019-09-24 ENCOUNTER — Other Ambulatory Visit: Payer: Self-pay | Admitting: Internal Medicine

## 2019-11-08 ENCOUNTER — Emergency Department: Payer: 59

## 2019-11-08 ENCOUNTER — Ambulatory Visit
Admission: EM | Admit: 2019-11-08 | Discharge: 2019-11-08 | Disposition: A | Discharge status: Home / Self Care | Payer: 59 | Attending: Internal Medicine | Admitting: Internal Medicine

## 2019-11-08 ENCOUNTER — Other Ambulatory Visit: Payer: Self-pay

## 2019-11-08 ENCOUNTER — Emergency Department
Admission: EM | Admit: 2019-11-08 | Discharge: 2019-11-08 | Disposition: A | Discharge status: Home / Self Care | Payer: 59 | Attending: Emergency Medicine | Admitting: Emergency Medicine

## 2019-11-08 DIAGNOSIS — I200 Unstable angina: Secondary | ICD-10-CM

## 2019-11-08 DIAGNOSIS — I10 Essential (primary) hypertension: Secondary | ICD-10-CM | POA: Insufficient documentation

## 2019-11-08 DIAGNOSIS — I161 Hypertensive emergency: Secondary | ICD-10-CM | POA: Diagnosis not present

## 2019-11-08 DIAGNOSIS — F17210 Nicotine dependence, cigarettes, uncomplicated: Secondary | ICD-10-CM | POA: Insufficient documentation

## 2019-11-08 DIAGNOSIS — Z79899 Other long term (current) drug therapy: Secondary | ICD-10-CM | POA: Diagnosis not present

## 2019-11-08 DIAGNOSIS — R0789 Other chest pain: Secondary | ICD-10-CM | POA: Insufficient documentation

## 2019-11-08 DIAGNOSIS — Z20822 Contact with and (suspected) exposure to covid-19: Secondary | ICD-10-CM | POA: Insufficient documentation

## 2019-11-08 LAB — TROPONIN I (HIGH SENSITIVITY)
Troponin I (High Sensitivity): 7 ng/L (ref <18)
Troponin I (High Sensitivity): 7 ng/L (ref <18)

## 2019-11-08 LAB — BASIC METABOLIC PANEL
Anion gap: 12 (ref 5–15)
BUN: 17 mg/dL (ref 6–20)
CO2: 26 mmol/L (ref 22–32)
Calcium: 9.7 mg/dL (ref 8.9–10.3)
Chloride: 99 mmol/L (ref 98–111)
Creatinine, Ser: 1.02 mg/dL (ref 0.61–1.24)
GFR calc Af Amer: >60 mL/min (ref >60)
GFR calc non Af Amer: >60 mL/min (ref >60)
Glucose, Bld: 106 mg/dL — ABNORMAL HIGH (ref 70–99)
Potassium: 4 mmol/L (ref 3.5–5.1)
Sodium: 137 mmol/L (ref 135–145)

## 2019-11-08 LAB — CBC
HCT: 43 % (ref 39.0–52.0)
Hemoglobin: 14.8 g/dL (ref 13.0–17.0)
MCH: 28.5 pg (ref 26.0–34.0)
MCHC: 34.4 g/dL (ref 30.0–36.0)
MCV: 82.7 fL (ref 80.0–100.0)
Platelets: 178 10*3/uL (ref 150–400)
RBC: 5.2 MIL/uL (ref 4.22–5.81)
RDW: 14.3 % (ref 11.5–15.5)
WBC: 6.4 10*3/uL (ref 4.0–10.5)
nRBC: 0 % (ref 0.0–0.2)

## 2019-11-08 MED ORDER — AMLODIPINE BESYLATE 5 MG PO TABS
5.0000 mg | ORAL_TABLET | Freq: Every day | ORAL | 1 refills | Status: DC
Start: 2019-11-08 — End: 2020-08-13

## 2019-11-08 MED ORDER — ASPIRIN 81 MG PO CHEW
324.0000 mg | CHEWABLE_TABLET | Freq: Once | ORAL | Status: AC
  Administered 2019-11-08: 324 mg via ORAL

## 2019-11-08 MED ORDER — AMLODIPINE BESYLATE 5 MG PO TABS
5.0000 mg | ORAL_TABLET | Freq: Every day | ORAL | 1 refills | Status: DC
Start: 2019-11-08 — End: 2019-11-08

## 2019-11-08 MED ORDER — AMLODIPINE BESYLATE 5 MG PO TABS
5.0000 mg | ORAL_TABLET | Freq: Once | ORAL | Status: AC
  Administered 2019-11-08: 5 mg via ORAL
  Filled 2019-11-08: qty 1

## 2019-11-08 MED ORDER — IOHEXOL 350 MG/ML SOLN
100.0000 mL | Freq: Once | INTRAVENOUS | Status: AC | PRN
  Administered 2019-11-08: 100 mL via INTRAVENOUS

## 2019-11-08 NOTE — ED Provider Notes (Signed)
[facility] Emergency Department Provider Note   ____________________________________________   First MD Initiated Contact with Patient 11/08/19 1543     (approximate)  I have reviewed the triage vital signs and the nursing notes.   HISTORY  Chief Complaint Chest Pain    HPI Leonard Lee is a 39 y.o. male with past medical history of hypertension who presents to the ED complaining of chest pain.  Patient reports that he got his second dose of the COVID-19 vaccine yesterday and when he woke up this morning he was feeling lightheaded with a dull ache in the center of his chest.  He reports subjective fevers and chills, but has not taken his temperature at home.  He briefly felt short of breath earlier today while at work, but he denies any cough and any breathing difficulty has now resolved.  His boss suggested he be evaluated at an urgent care and when his blood pressure was elevated there, he was referred to the ED for further evaluation.  He now states that he continues to have subjective fevers and chills, but any chest pain has resolved.  He is concerned that his blood pressure has been running high for a while and he has been trying to get in to see his PCP for a change in his medications, because he states he has been compliant with his lisinopril, which she took earlier today.        Past Medical History:  Diagnosis Date  . Hypertension     There are no problems to display for this patient.   Past Surgical History:  Procedure Laterality Date  . EYE SURGERY    . FINGER SURGERY      Prior to Admission medications   Medication Sig Start Date End Date Taking? Authorizing Provider  amLODipine (NORVASC) 5 MG tablet Take 1 tablet (5 mg total) by mouth daily. 11/08/19 11/07/20  Chesley Noon, MD  lisinopril-hydrochlorothiazide (ZESTORETIC) 20-12.5 MG tablet lisinopril 20 mg-hydrochlorothiazide 12.5 mg tablet    [provider]  albuterol  (VENTOLIN HFA) 108 (90 Base) MCG/ACT inhaler Inhale 2 puffs into the lungs every 4 (four) hours as needed for up to 7 days for wheezing or shortness of breath. For cough and shortness of breath 09/07/19 11/08/19  Rodriguez-Southworth, Nettie Elm, PA-C  lisinopril (PRINIVIL,ZESTRIL) 20 MG tablet Take 20 mg by mouth daily.  04/17/19  [provider]    Allergies Patient has no known allergies.  Family History  Problem Relation Age of Onset  . Heart disease Father     Social History Social History   Tobacco Use  . Smoking status: Current Every Day Smoker    Packs/day: 0.10    Types: Cigarettes  . Smokeless tobacco: Never Used  Vaping Use  . Vaping Use: Former  Substance Use Topics  . Alcohol use: Never  . Drug use: Not Currently    Review of Systems  Constitutional: Positive for fever/chills Eyes: No visual changes. ENT: No sore throat. Cardiovascular: Positive for chest pain. Respiratory: Denies shortness of breath. Gastrointestinal: No abdominal pain.  No nausea, no vomiting.  No diarrhea.  No constipation. Genitourinary: Negative for dysuria. Musculoskeletal: Negative for back pain. Skin: Negative for rash. Neurological: Negative for headaches, focal weakness or numbness.  ____________________________________________   PHYSICAL EXAM:  VITAL SIGNS: ED Triage Vitals  Enc Vitals Group     BP 11/08/19 1113 (!) 169/92     Pulse Rate 11/08/19 1113 (!) 104     Resp 11/08/19  1113 20     Temp 11/08/19 1113 (!) 100.8 F (38.2 C)     Temp Source 11/08/19 1113 Oral     SpO2 11/08/19 1113 97 %     Weight 11/08/19 1114 (!) 350 lb (158.8 kg)     Height 11/08/19 1114 6\' 3"  (1.905 m)     Head Circumference --      Peak Flow --      Pain Score 11/08/19 1132 4     Pain Loc --      Pain Edu? --      Excl. in GC? --     Constitutional: Alert and oriented. Eyes: Conjunctivae are normal. Head: Atraumatic. Nose: No congestion/rhinnorhea. Mouth/Throat: Mucous membranes  are moist. Neck: Normal ROM Cardiovascular: Normal rate, regular rhythm. Grossly normal heart sounds.  2+ radial pulses bilaterally. Respiratory: Normal respiratory effort.  No retractions. Lungs CTAB. Gastrointestinal: Soft and nontender. No distention. Genitourinary: deferred Musculoskeletal: No lower extremity tenderness nor edema. Neurologic:  Normal speech and language. No gross focal neurologic deficits are appreciated. Skin:  Skin is warm, dry and intact. No rash noted. Psychiatric: Mood and affect are normal. Speech and behavior are normal.  ____________________________________________   LABS (all labs ordered are listed, but only abnormal results are displayed)  Labs Reviewed  BASIC METABOLIC PANEL - Abnormal; Notable for the following components:      Result Value   Glucose, Bld 106 (*)    All other components within normal limits  SARS CORONAVIRUS 2 (TAT 6-24 HRS)  CBC  TROPONIN I (HIGH SENSITIVITY)  TROPONIN I (HIGH SENSITIVITY)   ____________________________________________  EKG  ED ECG REPORT I, 11/10/19, the attending physician, personally viewed and interpreted this ECG.   Date: 11/08/2019  EKG Time: 11:09  Rate: 108  Rhythm: sinus tachycardia  Axis: Normal  Intervals:none  ST&T Change: None   PROCEDURES  Procedure(s) performed (including Critical Care):  Procedures   ____________________________________________   INITIAL IMPRESSION / ASSESSMENT AND PLAN / ED COURSE       39 year old male with past medical history of hypertension presents to the ED complaining of subjective fevers and chills along with lightheadedness and chest pain starting this morning after receiving his second COVID-19 vaccine last night.  His chest pain has now resolved, however he continues to feel malaised with subjective fevers and chills.  He has a mild fever here with a temp of 100.8, potentially side effect from his recent vaccination versus COVID-19 infection,  would also consider PE as this can be associated with mild fever.  Low suspicion for ACS given reassuring EKG and initial troponin.  We will check CTA chest, repeat troponin, and testing for COVID-19.  Patient's blood pressure is also markedly elevated, although it sounds like it has been running as high for at least the past month.  Thus far there is no evidence of hypertensive emergency, patient states he has been compliant with lisinopril and we will add amlodipine here in the ED.  CTA is negative for acute process, no evidence of PE or pneumonia.  Patient's blood pressure is gradually improving following dose of amlodipine and he was written a prescription for this.  Repeat troponin is within normal limits and I doubt ACS at this point.  His fever seems likely to be a side effect of the COVID-19 vaccine and he is appropriate for discharge home with PCP follow-up.  He was counseled to return to the ED for new worsening symptoms, patient agrees with plan.  ____________________________________________   FINAL CLINICAL IMPRESSION(S) / ED DIAGNOSES  Final diagnoses:  Atypical chest pain  Essential hypertension     ED Discharge Orders         Ordered    amLODipine (NORVASC) 5 MG tablet  Daily     Discontinue  Reprint     11/08/19 1721           Note:  This document was prepared using Dragon voice recognition software and may include unintentional dictation errors.   Chesley Noon, MD 11/08/19 1725

## 2019-11-08 NOTE — ED Triage Notes (Signed)
Pt in via EMS from [facility]. Pt received 2nd dose of COVID vaccine and today satrted with CP radiating to the left. Pt also with family hx of cardiac issues. EKG WNL. Pt was given 324mg  of asa and 2 sprays of nitro. #18 to left AC. Pt was relieved with nitro.

## 2019-11-08 NOTE — ED Triage Notes (Signed)
See first nurse note. Pt reports CP and lightheadedness this morning. Received 2nd dose of covid vaccine yesterday. Pt speaking with this RN in complete sentences without shob. Skin warm and dry.

## 2019-11-08 NOTE — ED Provider Notes (Addendum)
[facility]    CSN: [id number] Arrival date & time: 11/08/19  0954      History   Chief Complaint Chief Complaint  Patient presents with  . Chest Pain    HPI Leonard Lee is a 39 y.o. male comes to urgent care this morning with precordial chest pain/pressure which started this morning.  Patient says pain is pressure type, with no known aggravating or relieving factors.  No cough or sputum production.  Pain is not radiating to the left shoulder or neck.  Patient's father suffered a myocardial infarction at the age of 66.  Patient has a history of hypertension on lisinopril-hydrochlorothiazide. HPI  Past Medical History:  Diagnosis Date  . Hypertension     There are no problems to display for this patient.   Past Surgical History:  Procedure Laterality Date  . EYE SURGERY    . FINGER SURGERY         Home Medications    Prior to Admission medications   Medication Sig Start Date End Date Taking? Authorizing Provider  lisinopril-hydrochlorothiazide (ZESTORETIC) 20-12.5 MG tablet lisinopril 20 mg-hydrochlorothiazide 12.5 mg tablet   Yes [provider]  albuterol (VENTOLIN HFA) 108 (90 Base) MCG/ACT inhaler Inhale 2 puffs into the lungs every 4 (four) hours as needed for up to 7 days for wheezing or shortness of breath. For cough and shortness of breath 09/07/19 09/14/19  Rodriguez-Southworth, Nettie Elm, PA-C  azithromycin (ZITHROMAX Z-PAK) 250 MG tablet Take 2 today, then 1 qd x 4 days 09/07/19   Rodriguez-Southworth, Nettie Elm, PA-C  lisinopril (PRINIVIL,ZESTRIL) 20 MG tablet Take 20 mg by mouth daily.  04/17/19  [provider]    Family History Family History  Problem Relation Age of Onset  . Heart disease Father     Social History Social History   Tobacco Use  . Smoking status: Current Every Day Smoker    Packs/day: 0.10    Types: Cigarettes  . Smokeless tobacco: Never Used  Vaping Use  . Vaping Use: Former  Substance Use Topics  .  Alcohol use: Never  . Drug use: Not Currently     Allergies   Patient has no known allergies.   Review of Systems Review of Systems  Constitutional: Negative.   HENT: Negative.   Respiratory: Positive for chest tightness. Negative for cough, shortness of breath and wheezing.   Cardiovascular: Positive for chest pain and palpitations.  Gastrointestinal: Negative.   Neurological: Positive for dizziness and light-headedness. Negative for facial asymmetry and headaches.  Psychiatric/Behavioral: Negative.      Physical Exam Triage Vital Signs ED Triage Vitals [11/08/19 0957]  Enc Vitals Group     BP      Pulse      Resp      Temp      Temp src      SpO2      Weight (!) 350 lb (158.8 kg)     Height 6\' 3"  (1.905 m)     Head Circumference      Peak Flow      Pain Score 5     Pain Loc      Pain Edu?      Excl. in GC?    No data found.  Updated Vital Signs BP (!) 232/115 (BP Location: Left Arm)   Pulse 99   Temp 99.1 F (37.3 C) (Oral)   Resp (P) 18   Ht 6\' 3"  (1.905 m)   Wt (!) 158.8 kg  SpO2 98%   BMI 43.75 kg/m   Visual Acuity Right Eye Distance:   Left Eye Distance:   Bilateral Distance:    Right Eye Near:   Left Eye Near:    Bilateral Near:     Physical Exam Vitals and nursing note reviewed.  Constitutional:      General: He is in acute distress.     Appearance: He is ill-appearing and diaphoretic.  Cardiovascular:     Rate and Rhythm: Tachycardia present.     Heart sounds: Normal heart sounds.  Pulmonary:     Effort: Pulmonary effort is normal.     Breath sounds: Normal breath sounds.  Abdominal:     General: Bowel sounds are normal.  Musculoskeletal:        General: Normal range of motion.     Cervical back: Normal range of motion.  Neurological:     Mental Status: He is alert.      UC Treatments / Results  Labs (all labs ordered are listed, but only abnormal results are displayed) Labs Reviewed - No data to  display  EKG   Radiology No results found.  Procedures Procedures (including critical care time)  Medications Ordered in UC Medications  aspirin chewable tablet 324 mg (324 mg Oral Given 11/08/19 1022)    Initial Impression / Assessment and Plan / UC Course  I have reviewed the triage vital signs and the nursing notes.  Pertinent labs & imaging results that were available during my care of the patient were reviewed by me and considered in my medical decision making (see chart for details).     1.  Chest pain worrisome for anginal chest pain: Aspirin 325 mg x 1 dose EKG shows normal sinus rhythm with no acute ST/T wave changes. Patient will be transported to the emergency department via EMS for further management  2.  Hypertensive emergency: Patient will need in-hospital management for his symptoms.  He is currently not on any medication.  Patient will be transported to the emergency department by EMS. Final Clinical Impressions(s) / UC Diagnoses   Final diagnoses:  None   Discharge Instructions   None    ED Prescriptions    None     PDMP not reviewed this encounter.   Merrilee Jansky, MD 11/08/19 1034    Merrilee Jansky, MD 11/08/19 1036

## 2019-11-08 NOTE — ED Notes (Signed)
Patient is being discharged from the Urgent Care and sent to the Emergency Department via EMS . Per Dr. Leonides Grills, patient is in need of higher level of care due to CP and HTN. Patient is aware and verbalizes understanding of plan of care. Report called to [facility] ED Charge RN.  Vitals:   11/08/19 1002 11/08/19 1012  BP: (!) (P) 232/112 (!) 232/115  Pulse: (P) 99 99  Resp: (P) 18   Temp: (P) 99.2 F (37.3 C) 99.1 F (37.3 C)  SpO2:  98%

## 2019-11-08 NOTE — ED Triage Notes (Signed)
Patient states that he received 2nd Moderna Vaccine yesterday and this morning around 9am he started having chest pain, clammy, chills and sweats and just doesn't feel "normal"

## 2019-11-09 LAB — SARS CORONAVIRUS 2 (TAT 6-24 HRS): SARS Coronavirus 2: NEGATIVE

## 2020-04-18 ENCOUNTER — Encounter: Payer: Self-pay | Admitting: Emergency Medicine

## 2020-04-18 ENCOUNTER — Ambulatory Visit
Admission: EM | Admit: 2020-04-18 | Discharge: 2020-04-18 | Disposition: A | Discharge status: Home / Self Care | Payer: 59 | Attending: Sports Medicine | Admitting: Sports Medicine

## 2020-04-18 ENCOUNTER — Other Ambulatory Visit: Payer: Self-pay

## 2020-04-18 DIAGNOSIS — R509 Fever, unspecified: Secondary | ICD-10-CM

## 2020-04-18 DIAGNOSIS — R6889 Other general symptoms and signs: Secondary | ICD-10-CM

## 2020-04-18 DIAGNOSIS — Z20822 Contact with and (suspected) exposure to covid-19: Secondary | ICD-10-CM | POA: Insufficient documentation

## 2020-04-18 DIAGNOSIS — Z79899 Other long term (current) drug therapy: Secondary | ICD-10-CM | POA: Insufficient documentation

## 2020-04-18 DIAGNOSIS — F17210 Nicotine dependence, cigarettes, uncomplicated: Secondary | ICD-10-CM | POA: Insufficient documentation

## 2020-04-18 DIAGNOSIS — R6883 Chills (without fever): Secondary | ICD-10-CM

## 2020-04-18 DIAGNOSIS — R519 Headache, unspecified: Secondary | ICD-10-CM

## 2020-04-18 NOTE — Discharge Instructions (Addendum)
I recommended getting a Covid test in the office today. We'll send out to the hospital. Will take between 6 and 24 hours to come back. In the interim I have asked him to go ahead and isolate and assume he is positive until he has a documented negative test. He voiced verbal understanding. Given a work note keeping him out of work until the test result comes back. If it is negative he can return to work on Monday, January 31st. If he is positive then someone will contact him and give him an updated work note and he'll need to quarantine per current CDC guidelines. Gave him an Production manager. Supportive care, over-the-counter meds as needed, Tylenol or Motrin for fever discomfort. If his headache was to worsen and become the worst headache of his life have instructed him to call 911 or go directly to the emergency room. He voiced verbal understanding. He'll follow up with [place] as needed.

## 2020-04-18 NOTE — ED Provider Notes (Signed)
[facility]    CSN: [id number] Arrival date & time: 04/18/20  1145      History   Chief Complaint Chief Complaint  Patient presents with  . Headache  . Chills    HPI Pepe Mineau is a 40 y.o. male.   Pleasant 40 year old male who presents for evaluation the above issues.  He reports last evening he started getting a headache with some swinging fevers and chills. He denies any chest pain shortness of breath. No cough rhinorrhea ear pain abdominal symptoms or urinary symptoms. He does have positive Covid exposure. His 24-year-old son is positive. He went to work today and they sent him to the urgent care and wanted him tested. He has been vaccinated x2 but no booster. He did not get the flu shot. No red flag signs or symptoms elicited on history.     Past Medical History:  Diagnosis Date  . Hypertension     There are no problems to display for this patient.   Past Surgical History:  Procedure Laterality Date  . EYE SURGERY    . FINGER SURGERY         Home Medications    Prior to Admission medications   Medication Sig Start Date End Date Taking? Authorizing Provider  amLODipine (NORVASC) 5 MG tablet Take 1 tablet (5 mg total) by mouth daily. 11/08/19 11/07/20 Yes Enid Derry, PA-C  lisinopril-hydrochlorothiazide (ZESTORETIC) 20-12.5 MG tablet lisinopril 20 mg-hydrochlorothiazide 12.5 mg tablet   Yes [provider]  albuterol (VENTOLIN HFA) 108 (90 Base) MCG/ACT inhaler Inhale 2 puffs into the lungs every 4 (four) hours as needed for up to 7 days for wheezing or shortness of breath. For cough and shortness of breath 09/07/19 11/08/19  Rodriguez-Southworth, Nettie Elm, PA-C  lisinopril (PRINIVIL,ZESTRIL) 20 MG tablet Take 20 mg by mouth daily.  04/17/19  [provider]    Family History Family History  Problem Relation Age of Onset  . Heart disease Father     Social History Social History   Tobacco Use  . Smoking status: Current Every  Day Smoker    Packs/day: 0.10    Types: Cigarettes  . Smokeless tobacco: Never Used  Vaping Use  . Vaping Use: Former  Substance Use Topics  . Alcohol use: Never  . Drug use: Not Currently     Allergies   Patient has no known allergies.   Review of Systems Review of Systems  Constitutional: Positive for chills and fever. Negative for activity change, appetite change, diaphoresis and fatigue.  HENT: Negative for congestion, ear discharge, ear pain, rhinorrhea, sinus pressure, sinus pain and sore throat.   Eyes: Negative for pain.  Respiratory: Negative for cough, chest tightness, shortness of breath and wheezing.   Cardiovascular: Negative for chest pain and palpitations.  Gastrointestinal: Negative for abdominal pain.  Genitourinary: Negative for dysuria, flank pain and hematuria.  Musculoskeletal: Negative for myalgias.  Skin: Negative for color change, pallor, rash and wound.  Neurological: Positive for headaches. Negative for dizziness, syncope, weakness, light-headedness and numbness.  All other systems reviewed and are negative.    Physical Exam Triage Vital Signs ED Triage Vitals  Enc Vitals Group     BP 04/18/20 1227 (!) 152/88     Pulse Rate 04/18/20 1227 80     Resp 04/18/20 1227 16     Temp 04/18/20 1227 98.1 F (36.7 C)     Temp Source 04/18/20 1227 Oral     SpO2 04/18/20 1227 99 %  Weight 04/18/20 1223 (!) 350 lb (158.8 kg)     Height 04/18/20 1223 6\' 3"  (1.905 m)     Head Circumference --      Peak Flow --      Pain Score 04/18/20 1223 2     Pain Loc --      Pain Edu? --      Excl. in GC? --    No data found.  Updated Vital Signs BP (!) 152/88 (BP Location: Right Arm)   Pulse 80   Temp 98.1 F (36.7 C) (Oral)   Resp 16   Ht 6\' 3"  (1.905 m)   Wt (!) 158.8 kg   SpO2 99%   BMI 43.75 kg/m   Visual Acuity Right Eye Distance:   Left Eye Distance:   Bilateral Distance:    Right Eye Near:   Left Eye Near:    Bilateral Near:      Physical Exam Vitals and nursing note reviewed.  Constitutional:      General: He is not in acute distress.    Appearance: He is well-developed. He is not toxic-appearing or diaphoretic.  HENT:     Head: Normocephalic and atraumatic.     Mouth/Throat:     Mouth: Mucous membranes are moist.     Pharynx: Oropharynx is clear.  Eyes:     General: No visual field deficit.    Extraocular Movements: Extraocular movements intact.     Pupils: Pupils are equal, round, and reactive to light.  Cardiovascular:     Rate and Rhythm: Normal rate and regular rhythm.     Heart sounds: Normal heart sounds. No murmur heard. No gallop.   Pulmonary:     Effort: Pulmonary effort is normal. No respiratory distress.     Breath sounds: Normal breath sounds. No stridor. No wheezing, rhonchi or rales.  Musculoskeletal:     Cervical back: Normal range of motion and neck supple. No rigidity.  Lymphadenopathy:     Cervical: Cervical adenopathy present.  Skin:    General: Skin is warm and dry.     Capillary Refill: Capillary refill takes less than 2 seconds.  Neurological:     Mental Status: He is alert.     GCS: GCS eye subscore is 4. GCS verbal subscore is 5. GCS motor subscore is 6.     Cranial Nerves: No cranial nerve deficit or dysarthria.     Sensory: No sensory deficit.     Motor: No weakness.      UC Treatments / Results  Labs (all labs ordered are listed, but only abnormal results are displayed) Labs Reviewed  SARS CORONAVIRUS 2 (TAT 6-24 HRS)    EKG   Radiology No results found.  Procedures Procedures (including critical care time)  Medications Ordered in UC Medications - No data to display  Initial Impression / Assessment and Plan / UC Course  I have reviewed the triage vital signs and the nursing notes.  Pertinent labs & imaging results that were available during my care of the patient were reviewed by me and considered in my medical decision making (see chart for  details).  Clinical impression: Pleasant 40 year old male who presents with acute onset of headache fever chills last evening. He does have Covid exposure. He has been vaccinated.  Treatment plan: 1. The findings and treatment plan were discussed in detail with the patient. Patient was in agreement. 2. I recommended getting a Covid test in the office today. We'll send out to the  hospital. Will take between 6 and 24 hours to come back. In the interim I have asked him to go ahead and isolate and assume he is positive until he has a documented negative test. He voiced verbal understanding. 3. Given a work note keeping him out of work until the test result comes back. If it is negative he can return to work on Monday, January 31st. If he is positive then someone will contact him and give him an updated work note and he'll need to quarantine per current CDC guidelines. 4. Gave him an Production manager. 5. Supportive care, over-the-counter meds as needed, Tylenol or Motrin for fever discomfort. 6. If his headache was to worsen and become the worst headache of his life have instructed him to call 911 or go directly to the emergency room. He voiced verbal understanding. 7. He'll follow up with [place] as needed.     Final Clinical Impressions(s) / UC Diagnoses   Final diagnoses:  Flu-like symptoms  Acute nonintractable headache, unspecified headache type  Chills  Fever, unspecified     Discharge Instructions     I recommended getting a Covid test in the office today. We'll send out to the hospital. Will take between 6 and 24 hours to come back. In the interim I have asked him to go ahead and isolate and assume he is positive until he has a documented negative test. He voiced verbal understanding. Given a work note keeping him out of work until the test result comes back. If it is negative he can return to work on Monday, January 31st. If he is positive then someone will contact him and give him an  updated work note and he'll need to quarantine per current CDC guidelines. Gave him an Production manager. Supportive care, over-the-counter meds as needed, Tylenol or Motrin for fever discomfort. If his headache was to worsen and become the worst headache of his life have instructed him to call 911 or go directly to the emergency room. He voiced verbal understanding. He'll follow up with [place] as needed.    ED Prescriptions    None     PDMP not reviewed this encounter.   Delton See, MD 04/18/20 [phone]

## 2020-04-18 NOTE — ED Triage Notes (Signed)
Patient c/o HAs and chills that started last night.  Patient states that his son was diagnosed with COVID yesterday.  Patient denies fevers.

## 2020-04-19 LAB — SARS CORONAVIRUS 2 (TAT 6-24 HRS): SARS Coronavirus 2: NEGATIVE

## 2020-06-04 ENCOUNTER — Telehealth: Payer: Self-pay | Admitting: Podiatry

## 2020-06-04 ENCOUNTER — Telehealth: Payer: Self-pay | Admitting: *Deleted

## 2020-06-04 ENCOUNTER — Other Ambulatory Visit: Payer: Self-pay

## 2020-06-04 ENCOUNTER — Ambulatory Visit (INDEPENDENT_AMBULATORY_CARE_PROVIDER_SITE_OTHER): Payer: Self-pay | Admitting: Podiatry

## 2020-06-04 DIAGNOSIS — B070 Plantar wart: Secondary | ICD-10-CM

## 2020-06-04 MED ORDER — HYDROCODONE-ACETAMINOPHEN 5-325 MG PO TABS: 1.0000 | ORAL_TABLET | Freq: Four times a day (QID) | ORAL | 0 refills | Status: DC | PRN

## 2020-06-04 NOTE — Telephone Encounter (Signed)
"  The prescription he sent in for me is on [facility].  Can he call me in something else or can he call it in to [facility]?"  He said he can call you in some Ibuprofen 800 mg.  "I would rather have what he said he would call in.  Ibuprofen just messes up my stomach.  Can he just send a prescription to [facility]?"  I will give him your message.  "I'm sorry to call back again.  I asked the pharmacy to transfer the prescription to [facility] and they said they could not do that because it's against regulations."  Dr. Logan Bores has sent a prescription to [facility].  "Okay, thank you."

## 2020-06-04 NOTE — Telephone Encounter (Signed)
Patient stated they were told they would be prescribed hydrocodone but it has not been sent to the pharmacy, Please Advise

## 2020-06-18 ENCOUNTER — Ambulatory Visit (INDEPENDENT_AMBULATORY_CARE_PROVIDER_SITE_OTHER): Payer: Self-pay | Admitting: Podiatry

## 2020-06-18 ENCOUNTER — Other Ambulatory Visit: Payer: Self-pay

## 2020-06-18 DIAGNOSIS — B070 Plantar wart: Secondary | ICD-10-CM

## 2020-06-18 NOTE — Progress Notes (Signed)
   Subjective: 40 y.o. male presenting today for follow-up evaluation of the plantar verruca to the left foot.  Last visit on 06/04/2020 Cantharone was applied.  Patient states that it feels much better now.  There was approximately 5 days of pain after application of the Cantharone.  He states that he walks approximately 5-7 miles per day at work according to his tracker.  No new complaints at this time   Past Medical History:  Diagnosis Date  . Hypertension     Objective: Physical Exam General: The patient is alert and oriented x3 in no acute distress.   Dermatology: Hyperkeratotic skin lesion(s) noted to the plantar aspect of the left foot approximately 0.5 cm in diameter. Pinpoint bleeding noted upon debridement. Skin is warm, dry and supple bilateral lower extremities. Negative for open lesions or macerations.   Vascular: Palpable pedal pulses bilaterally. No edema or erythema noted. Capillary refill within normal limits.   Neurological: Epicritic and protective threshold grossly intact bilaterally.    Musculoskeletal Exam: Pain on palpation to the noted skin lesion(s).  Range of motion within normal limits to all pedal and ankle joints bilateral. Muscle strength 5/5 in all groups bilateral.    Assessment: #1 plantar wart left foot   Plan of Care:  #1 Patient was evaluated. #2 Excisional debridement of the plantar wart lesion(s) was performed using a chisel blade.  Salicylic acid was applied and the lesion(s) was dressed with a dry sterile dressing. #3 recommend OTC wart remover x4 weeks daily #4 return to clinic as needed  *From [facility]  Felecia Shelling, DPM [facility]  Dr. Felecia Shelling, DPM    [address] [place], [place] [address]                Office [phone]  Fax [phone]

## 2020-06-18 NOTE — Progress Notes (Signed)
   Subjective: 40 y.o. male presenting today as a new patient for evaluation of a symptomatic painful lesion to the plantar aspect of the left foot.  This is been present for approximately 1-2 months.  Gradual onset.  He denies a history of injury.  Over the past couple of months it is only gotten harder to walk and has become very painful.  He has not done anything for treatment.   Past Medical History:  Diagnosis Date  . Hypertension     Objective: Physical Exam General: The patient is alert and oriented x3 in no acute distress.   Dermatology: Hyperkeratotic skin lesion(s) noted to the plantar aspect of the left foot approximately 1 cm in diameter. Pinpoint bleeding noted upon debridement. Skin is warm, dry and supple bilateral lower extremities. Negative for open lesions or macerations.   Vascular: Palpable pedal pulses bilaterally. No edema or erythema noted. Capillary refill within normal limits.   Neurological: Epicritic and protective threshold grossly intact bilaterally.    Musculoskeletal Exam: Pain on palpation to the noted skin lesion(s).  Range of motion within normal limits to all pedal and ankle joints bilateral. Muscle strength 5/5 in all groups bilateral.    Assessment: #1 plantar wart left foot   Plan of Care:  #1 Patient was evaluated. #2 Excisional debridement of the plantar wart lesion(s) was performed using a chisel blade. Cantharone was applied and the lesion(s) was dressed with a dry sterile dressing. #3 patient is to return to clinic in 2 weeks  *From [facility]  Felecia Shelling, DPM [facility]  Dr. Felecia Shelling, DPM    [address] [place], [place] [address]                Office [phone]  Fax [phone]

## 2020-06-21 ENCOUNTER — Ambulatory Visit: Payer: Self-pay | Admitting: Podiatry

## 2020-07-02 ENCOUNTER — Other Ambulatory Visit: Payer: Self-pay

## 2020-07-02 ENCOUNTER — Telehealth: Payer: Self-pay

## 2020-07-02 ENCOUNTER — Ambulatory Visit (INDEPENDENT_AMBULATORY_CARE_PROVIDER_SITE_OTHER): Payer: Self-pay | Admitting: Podiatry

## 2020-07-02 ENCOUNTER — Encounter: Payer: Self-pay | Admitting: Podiatry

## 2020-07-02 DIAGNOSIS — B070 Plantar wart: Secondary | ICD-10-CM

## 2020-07-02 MED ORDER — HYDROCODONE-ACETAMINOPHEN 5-325 MG PO TABS: 1.0000 | ORAL_TABLET | Freq: Four times a day (QID) | ORAL | 0 refills | Status: DC | PRN

## 2020-07-02 NOTE — Telephone Encounter (Signed)
Erroneous entry

## 2020-07-03 ENCOUNTER — Encounter: Payer: Self-pay | Admitting: Podiatry

## 2020-07-03 NOTE — Progress Notes (Signed)
  Subjective:  Patient ID: Leonard Lee, male    DOB: 12/27/1980,  MRN: [id number]  Chief Complaint  Patient presents with  . Plantar Warts    "its no better.  Still painful and throbs at night"    40 y.o. male presents with the above complaint.  Patient presents with complaint of left foot plantar verruca.  Patient states that he had some Cantharone therapy application that was applied once by Dr. Logan Bores.  He states the catheter therapy has been helping.  He had some pain with it but overall has has been manageable.  He denies any other acute complaints.   Review of Systems: Negative except as noted in the HPI. Denies N/V/F/Ch.  Past Medical History:  Diagnosis Date  . Hypertension     Current Outpatient Medications:  .  HYDROcodone-acetaminophen (NORCO) 5-325 MG tablet, Take 1 tablet by mouth every 6 (six) hours as needed for moderate pain., Disp: 30 tablet, Rfl: 0 .  amLODipine (NORVASC) 5 MG tablet, Take 1 tablet (5 mg total) by mouth daily., Disp: 30 tablet, Rfl: 1 .  HYDROcodone-acetaminophen (NORCO/VICODIN) 5-325 MG tablet, Take 1 tablet by mouth every 6 (six) hours as needed for moderate pain., Disp: 30 tablet, Rfl: 0 .  lisinopril-hydrochlorothiazide (ZESTORETIC) 20-12.5 MG tablet, lisinopril 20 mg-hydrochlorothiazide 12.5 mg tablet, Disp: , Rfl:   Social History   Tobacco Use  Smoking Status Current Every Day Smoker  . Packs/day: 0.10  . Types: Cigarettes  Smokeless Tobacco Never Used    No Known Allergies Objective:  There were no vitals filed for this visit. There is no height or weight on file to calculate BMI. Constitutional Well developed. Well nourished.  Vascular Dorsalis pedis pulses palpable bilaterally. Posterior tibial pulses palpable bilaterally. Capillary refill normal to all digits.  No cyanosis or clubbing noted. Pedal hair growth normal.  Neurologic Normal speech. Oriented to person, place, and time. Epicritic sensation to light touch grossly  present bilaterally.  Dermatologic  hyperkeratotic lesion noted to the left lateral foot.  Pain on palpation to the lesion.  Pinpoint bleeding noted upon debridement  Orthopedic: Normal joint ROM without pain or crepitus bilaterally. No visible deformities. No bony tenderness.   Radiographs: None Assessment:   1. Plantar wart, left foot    Plan:  Patient was evaluated and treated and all questions answered.  Left foot plantar verruca -I explained the patient the etiology of plantar verruca and various treatment options were discussed.  I believe patient will benefit from continued application of Cantharone therapy and to resolve meant of the lesion. --Lesion was debrided today without complications. Hemostasis was achieved and the area was cleaned. Cantharone was applied followed by an occlusive bandage. Post procedure complications were discussed. Monitor for signs or symptoms of infection and directed to call the office mainly should any occur.   No follow-ups on file.

## 2020-07-30 ENCOUNTER — Ambulatory Visit: Payer: Self-pay | Admitting: Podiatry

## 2020-08-13 ENCOUNTER — Other Ambulatory Visit: Payer: Self-pay

## 2020-08-13 ENCOUNTER — Encounter: Payer: Self-pay | Admitting: Emergency Medicine

## 2020-08-13 ENCOUNTER — Ambulatory Visit
Admission: EM | Admit: 2020-08-13 | Discharge: 2020-08-13 | Disposition: A | Discharge status: Home / Self Care | Payer: Self-pay | Attending: Family Medicine | Admitting: Family Medicine

## 2020-08-13 DIAGNOSIS — I10 Essential (primary) hypertension: Secondary | ICD-10-CM

## 2020-08-13 MED ORDER — HYDROCHLOROTHIAZIDE 12.5 MG PO CAPS: 12.5000 mg | ORAL_CAPSULE | Freq: Every day | ORAL | 0 refills | Status: DC

## 2020-08-13 MED ORDER — LISINOPRIL 20 MG PO TABS
20.0000 mg | ORAL_TABLET | Freq: Every day | ORAL | 0 refills | Status: DC
Start: 2020-08-13 — End: 2021-04-22

## 2020-08-13 MED ORDER — AMLODIPINE BESYLATE 5 MG PO TABS
5.0000 mg | ORAL_TABLET | Freq: Every day | ORAL | 0 refills | Status: DC
Start: 2020-08-13 — End: 2021-04-22

## 2020-08-13 NOTE — ED Triage Notes (Signed)
Pt states he ran out of BP medications and he has been having headaches, so he thinks his BP is elevated. He has not checked it at home.

## 2020-08-13 NOTE — Discharge Instructions (Signed)
Medication as prescribed.  Please follow up with your doctor.  Take care  Dr. Adriana Simas

## 2020-08-13 NOTE — ED Provider Notes (Signed)
[facility]    CSN: [id number] Arrival date & time: 08/13/20  1724      History   Chief Complaint Chief Complaint  Patient presents with  . Hypertension   HPI  40 year old male presents with hypertension.  Patient reports that he has out of one of his medications and will be out of the other 2 very soon.  He states that he takes amlodipine 5 mg daily, lisinopril 20 mg daily and he is currently out of HCTZ.  His blood pressure has been elevated at times at home.  Mildly elevated today.  He states that he has had intermittent headaches.  He denies chest pain or shortness of breath at this time.  No other complaints.  Past Medical History:  Diagnosis Date  . Hypertension    Past Surgical History:  Procedure Laterality Date  . EYE SURGERY    . FINGER SURGERY     Home Medications    Prior to Admission medications   Medication Sig Start Date End Date Taking? Authorizing Provider  hydrochlorothiazide (MICROZIDE) 12.5 MG capsule Take 1 capsule (12.5 mg total) by mouth daily. 08/13/20  Yes Leona Pressly G, DO  lisinopril (ZESTRIL) 20 MG tablet Take 1 tablet (20 mg total) by mouth daily. 08/13/20  Yes Lindsay Straka G, DO  lisinopril-hydrochlorothiazide (ZESTORETIC) 20-12.5 MG tablet lisinopril 20 mg-hydrochlorothiazide 12.5 mg tablet  08/13/20 Yes [provider]  amLODipine (NORVASC) 5 MG tablet Take 1 tablet (5 mg total) by mouth daily. 08/13/20 08/13/21  Tommie Sams, DO  albuterol (VENTOLIN HFA) 108 (90 Base) MCG/ACT inhaler Inhale 2 puffs into the lungs every 4 (four) hours as needed for up to 7 days for wheezing or shortness of breath. For cough and shortness of breath 09/07/19 11/08/19  Rodriguez-Southworth, Nettie Elm, PA-C    Family History Family History  Problem Relation Age of Onset  . Heart disease Father     Social History Social History   Tobacco Use  . Smoking status: Current Every Day Smoker    Packs/day: 0.10    Types: Cigarettes  . Smokeless  tobacco: Never Used  Vaping Use  . Vaping Use: Former  Substance Use Topics  . Alcohol use: Never  . Drug use: Not Currently     Allergies   Patient has no known allergies.   Review of Systems Review of Systems  Respiratory: Negative.   Cardiovascular: Negative.   Neurological: Positive for headaches.   Physical Exam Triage Vital Signs ED Triage Vitals  Enc Vitals Group     BP 08/13/20 1821 (!) 153/95     Pulse Rate 08/13/20 1821 79     Resp 08/13/20 1821 18     Temp 08/13/20 1821 98.4 F (36.9 C)     Temp Source 08/13/20 1821 Oral     SpO2 08/13/20 1821 97 %     Weight 08/13/20 1820 (!) 350 lb 1.5 oz (158.8 kg)     Height 08/13/20 1820 6\' 3"  (1.905 m)     Head Circumference --      Peak Flow --      Pain Score 08/13/20 1820 0     Pain Loc --      Pain Edu? --      Excl. in GC? --    No data found.  Updated Vital Signs BP (!) 153/95 (BP Location: Left Arm)   Pulse 79   Temp 98.4 F (36.9 C) (Oral)   Resp 18   Ht 6\' 3"  (  1.905 m)   Wt (!) 158.8 kg   SpO2 97%   BMI 43.76 kg/m   Visual Acuity Right Eye Distance:   Left Eye Distance:   Bilateral Distance:    Right Eye Near:   Left Eye Near:    Bilateral Near:     Physical Exam Constitutional:      General: He is not in acute distress.    Appearance: Normal appearance. He is obese. He is not ill-appearing.  HENT:     Head: Normocephalic and atraumatic.  Cardiovascular:     Rate and Rhythm: Normal rate and regular rhythm.  Pulmonary:     Effort: Pulmonary effort is normal.     Breath sounds: Normal breath sounds. No wheezing, rhonchi or rales.  Neurological:     Mental Status: He is alert.  Psychiatric:        Mood and Affect: Mood normal.        Behavior: Behavior normal.    UC Treatments / Results  Labs (all labs ordered are listed, but only abnormal results are displayed) Labs Reviewed - No data to display  EKG   Radiology No results found.  Procedures Procedures (including  critical care time)  Medications Ordered in UC Medications - No data to display  Initial Impression / Assessment and Plan / UC Course  I have reviewed the triage vital signs and the nursing notes.  Pertinent labs & imaging results that were available during my care of the patient were reviewed by me and considered in my medical decision making (see chart for details).    40 year old male presents with hypertension. His hypertension is uncontrolled. He is out of one of his medications and the others will be out soon.  I have refilled amlodipine, lisinopril, and HCTZ.  Final Clinical Impressions(s) / UC Diagnoses   Final diagnoses:  Essential hypertension     Discharge Instructions     Medication as prescribed.  Please follow up with your doctor.  Take care  Dr. Adriana Simas    ED Prescriptions    Medication Sig Dispense Auth. Provider   amLODipine (NORVASC) 5 MG tablet Take 1 tablet (5 mg total) by mouth daily. 90 tablet Solomiya Pascale G, DO   lisinopril (ZESTRIL) 20 MG tablet Take 1 tablet (20 mg total) by mouth daily. 90 tablet Zeanna Sunde G, DO   hydrochlorothiazide (MICROZIDE) 12.5 MG capsule Take 1 capsule (12.5 mg total) by mouth daily. 90 capsule Everlene Other G, DO     PDMP not reviewed this encounter.   Tommie Sams, [place] 08/13/20 [phone]

## 2020-10-22 IMAGING — CT CT ANGIO CHEST
1 of 8 series · 18 of 46 positions shown · IV contrast (APPLIED)
Comparison: Same day chest x-ray

CLINICAL DATA: Chest pain and lightheadedness

EXAM:
CT ANGIOGRAPHY CHEST WITH CONTRAST
TECHNIQUE: Multidetector CT imaging of the chest was performed using the
standard protocol during bolus administration of intravenous
contrast. Multiplanar CT image reconstructions and MIPs were
obtained to evaluate the vascular anatomy.
CONTRAST:  100mL OMNIPAQUE IOHEXOL 350 MG/ML SOLN

[Series 5: thins · axial · 0.78mm/px · z∈[-436,-148]mm · 18 of 316 slices shown]
[im 14/316  lung]
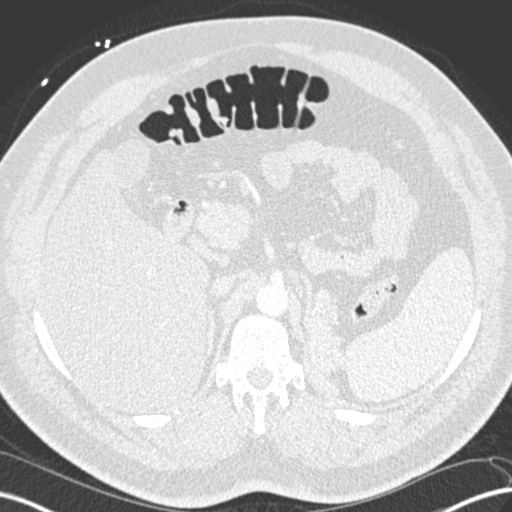
[im 28/316  soft-tissue]
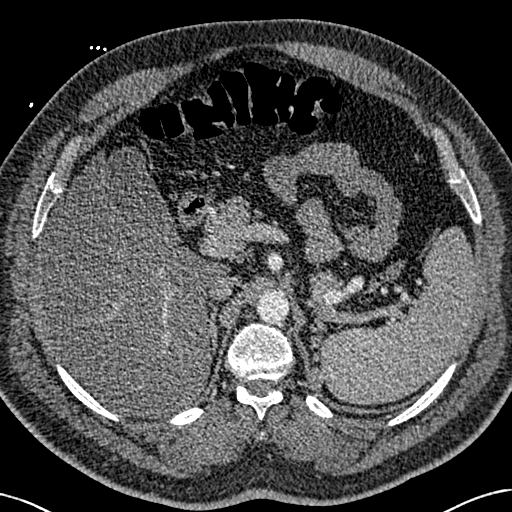
[im 55/316  lung]
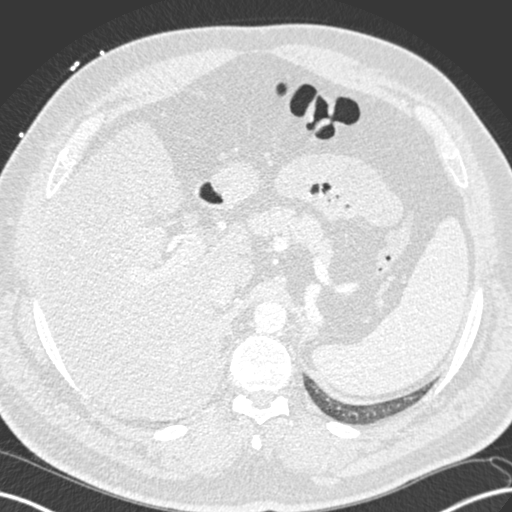
[im 69/316  soft-tissue]
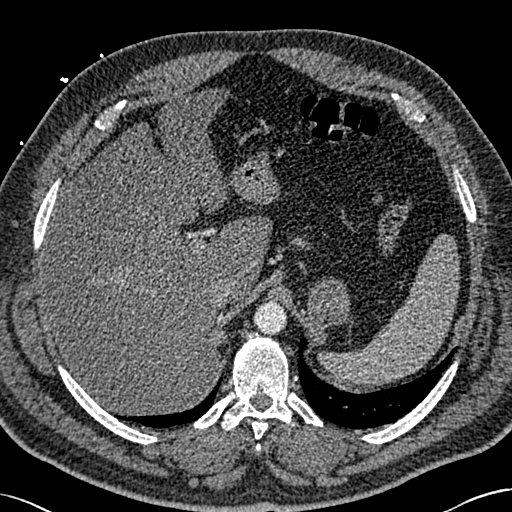
[im 83/316  lung]
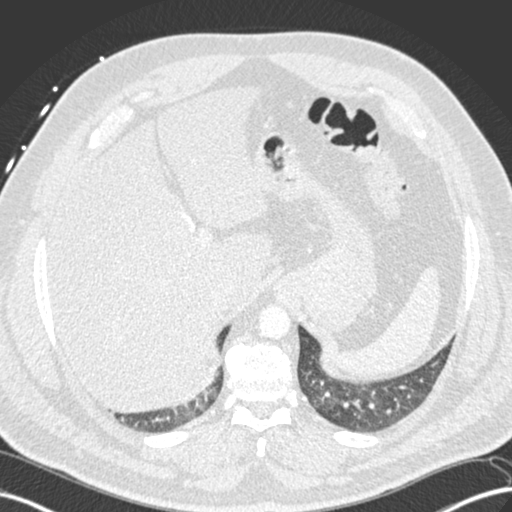
[im 96/316  soft-tissue]
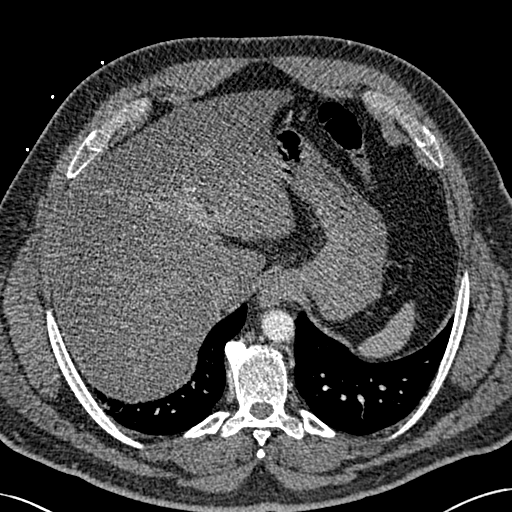
[im 110/316  lung]
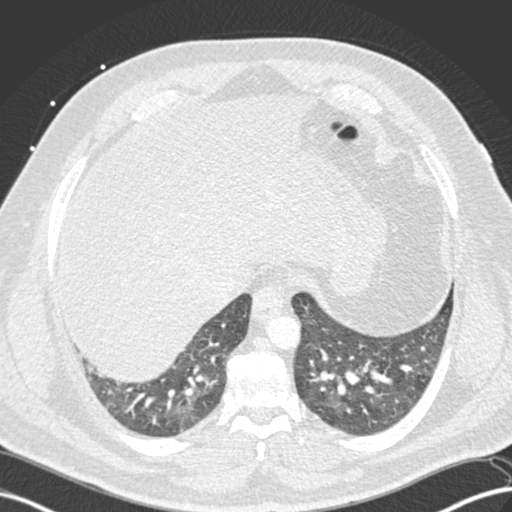
[im 137/316  soft-tissue]
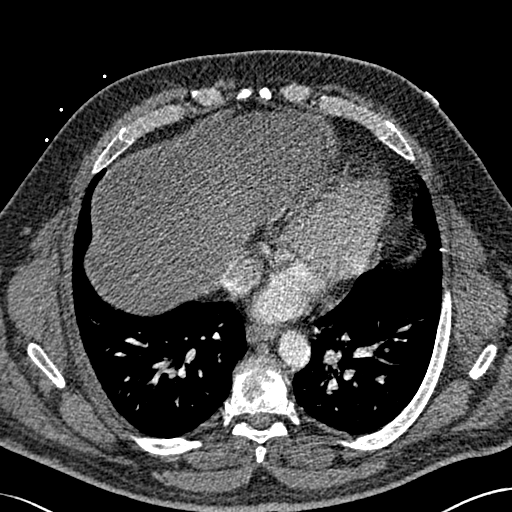
[im 151/316  lung]
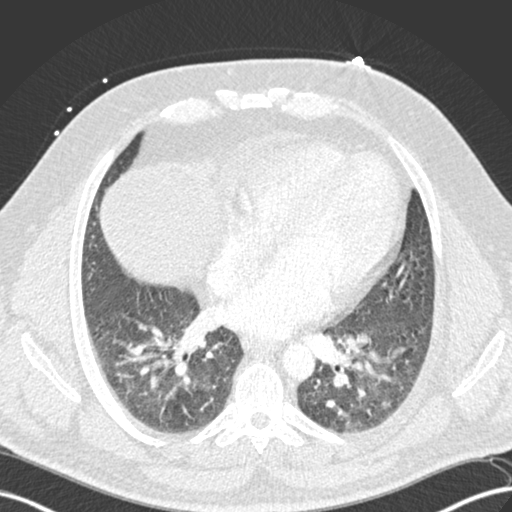
[im 165/316  soft-tissue]
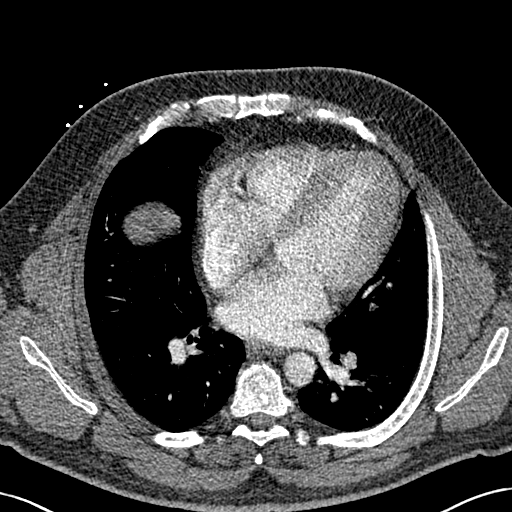
[im 179/316  lung]
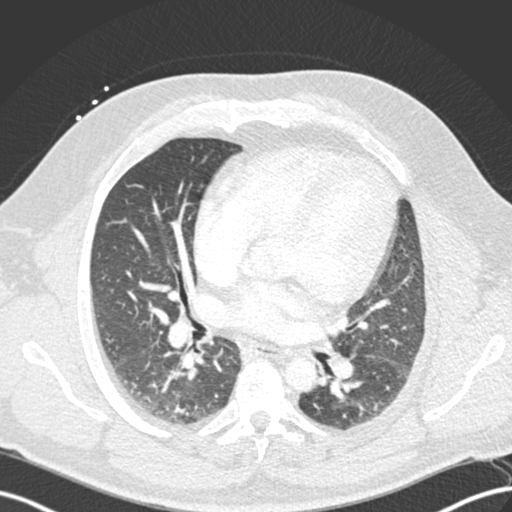
[im 206/316  soft-tissue]
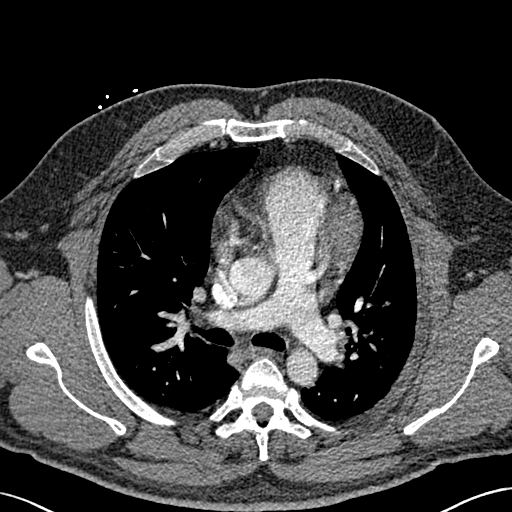
[im 220/316  lung]
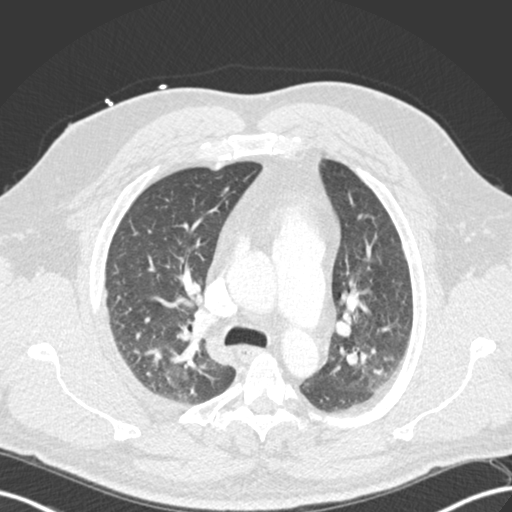
[im 233/316  soft-tissue]
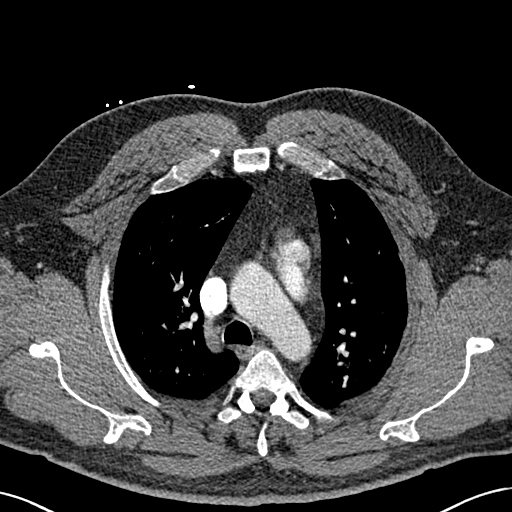
[im 247/316  lung]
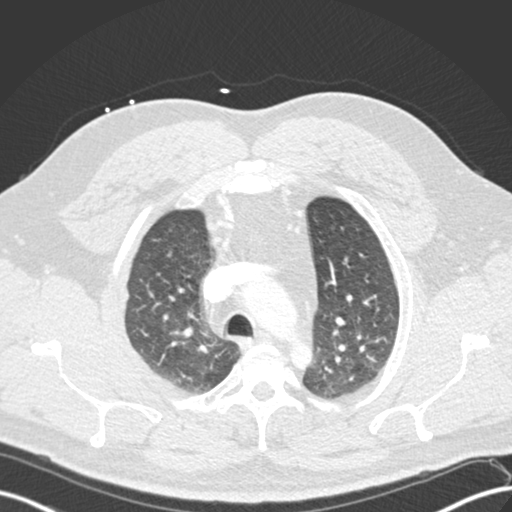
[im 261/316  soft-tissue]
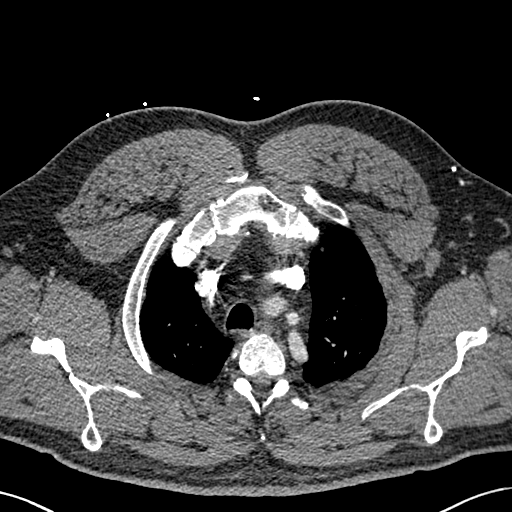
[im 288/316  lung]
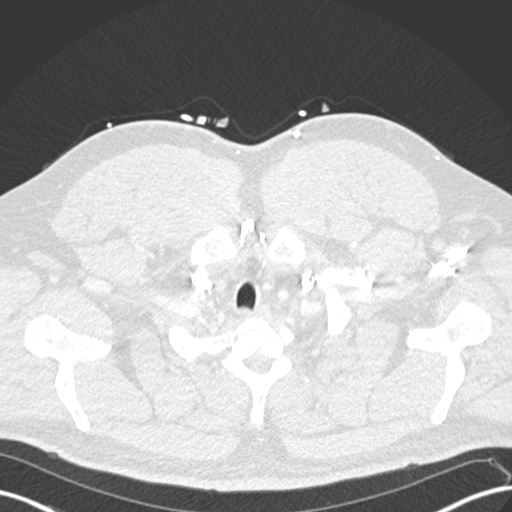
[im 302/316  soft-tissue]
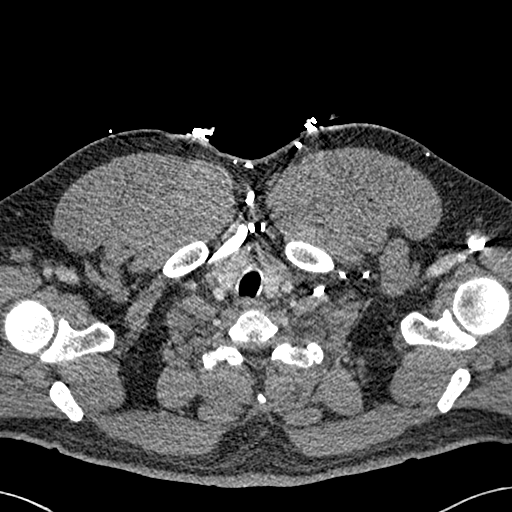

[18 of 46 positions shown; findings below may reference images not displayed]

FINDINGS: Cardiovascular: Examination is slightly limited secondary to
suboptimal contrast bolus timing, beam hardening artifact related to
body habitus, and respiratory motion. No evidence of pulmonary
arterial filling defect to the lobar branch level. Pulmonary trunk
measures 3.3 cm in diameter. Thoracic aorta is nonaneurysmal. Heart
size is within normal limits. No pericardial effusion.

Mediastinum/Nodes: No enlarged mediastinal, hilar, or axillary lymph
nodes. Thyroid gland, trachea, and esophagus demonstrate no
significant findings.

Lungs/Pleura: 3 mm sub solid right middle lobe pulmonary nodule
(series 8, image 42). Mosaic attenuation within the lung bases
suggesting air trapping versus small vessel or small airways
disease. No focal airspace consolidation, pleural effusion, or
pneumothorax.

Upper Abdomen: Diffusely decreased attenuation of the hepatic
parenchyma compatible with hepatic steatosis. No acute findings
within the visualized portion of the upper abdomen.

Musculoskeletal: No chest wall abnormality. No acute or significant
osseous findings.

Review of the MIP images confirms the above findings.
IMPRESSION: 1. Limited exam. No evidence of pulmonary embolism to the lobar
branch level.
2. Mosaic attenuation within the lung bases suggesting air trapping
versus small vessel or small airways disease.
3. Mildly dilated main pulmonary trunk, which can be seen with
pulmonary arterial hypertension.
4. Hepatic steatosis.
5. 3 mm sub-solid right middle lobe pulmonary nodule. No follow-up
recommended. This recommendation follows the consensus statement:
Guidelines for Management of Incidental Pulmonary Nodules Detected

## 2020-11-08 ENCOUNTER — Other Ambulatory Visit: Payer: Self-pay | Admitting: Family Medicine

## 2021-01-22 ENCOUNTER — Emergency Department: Payer: Managed Care, Other (non HMO)

## 2021-01-22 ENCOUNTER — Other Ambulatory Visit: Payer: Self-pay

## 2021-01-22 ENCOUNTER — Encounter: Payer: Self-pay | Admitting: *Deleted

## 2021-01-22 DIAGNOSIS — R002 Palpitations: Secondary | ICD-10-CM | POA: Insufficient documentation

## 2021-01-22 DIAGNOSIS — R079 Chest pain, unspecified: Secondary | ICD-10-CM | POA: Insufficient documentation

## 2021-01-22 DIAGNOSIS — I10 Essential (primary) hypertension: Secondary | ICD-10-CM | POA: Insufficient documentation

## 2021-01-22 DIAGNOSIS — R059 Cough, unspecified: Secondary | ICD-10-CM | POA: Insufficient documentation

## 2021-01-22 DIAGNOSIS — Z5321 Procedure and treatment not carried out due to patient leaving prior to being seen by health care provider: Secondary | ICD-10-CM | POA: Insufficient documentation

## 2021-01-22 LAB — CBC
HCT: 43.1 % (ref 39.0–52.0)
Hemoglobin: 14.5 g/dL (ref 13.0–17.0)
MCH: 27.3 pg (ref 26.0–34.0)
MCHC: 33.6 g/dL (ref 30.0–36.0)
MCV: 81 fL (ref 80.0–100.0)
Platelets: 205 10*3/uL (ref 150–400)
RBC: 5.32 MIL/uL (ref 4.22–5.81)
RDW: 12.9 % (ref 11.5–15.5)
WBC: 6.9 10*3/uL (ref 4.0–10.5)
nRBC: 0 % (ref 0.0–0.2)

## 2021-01-22 LAB — BASIC METABOLIC PANEL
Anion gap: 10 (ref 5–15)
BUN: 13 mg/dL (ref 6–20)
CO2: 24 mmol/L (ref 22–32)
Calcium: 9.3 mg/dL (ref 8.9–10.3)
Chloride: 103 mmol/L (ref 98–111)
Creatinine, Ser: 0.94 mg/dL (ref 0.61–1.24)
GFR, Estimated: >60 mL/min (ref >60)
Glucose, Bld: 164 mg/dL — ABNORMAL HIGH (ref 70–99)
Potassium: 3.4 mmol/L — ABNORMAL LOW (ref 3.5–5.1)
Sodium: 137 mmol/L (ref 135–145)

## 2021-01-22 LAB — TROPONIN I (HIGH SENSITIVITY): Troponin I (High Sensitivity): 7 ng/L (ref <18)

## 2021-01-22 NOTE — ED Triage Notes (Signed)
Pt reports chest pain , fluctuating blood pressures.  No sob.  Pt has a cough.  Pt alert  speech clear.  Sx began today.

## 2021-01-22 NOTE — ED Provider Notes (Signed)
Emergency Medicine Provider Triage Evaluation Note  Gervis Gaba , a 40 y.o. male  was evaluated in triage.  Pt complains of hypertension, palpitations.  Patient states he took his blood pressure at home, noted it to be elevated, retook his blood pressure and noted it to be very low.  He developed palpitations/chest pain and felt as if his heart was racing.  Patient states his heart rate was in the 160s.  He came into the emergency department, currently feeling better with no palpitations, chest pain or shortness of breath.  He has a history of hypertension, takes medication daily.  Review of Systems  Positive: Palpitations, chest pain Negative: Shortness of breath, headaches, nausea, vomiting, vision changes  Physical Exam  BP (!) 164/94 (BP Location: Left Arm)   Pulse 95   Resp (!) 23   Ht 6\' 2"  (1.88 m)   Wt (!) 174.6 kg   SpO2 94%   BMI 49.43 kg/m  Gen:   Awake, no distress   Resp:  Normal effort  MSK:   Moves extremities without difficulty  Other:    Medical Decision Making  Medically screening exam initiated at 8:55 PM.  Appropriate orders placed.  Leland Raver was informed that the remainder of the evaluation will be completed by another provider, this initial triage assessment does not replace that evaluation, and the importance of remaining in the ED until their evaluation is complete.  40 year old male with chest pain, palpitations.  Has a history of hypertension and noted to be hypertensive at home.  Will order cardiac work-up and continue to monitor.  Currently blood pressure improved along with heart rate.  Vital signs are stable   46 01/22/21 2057    2058, MD 01/23/21 [phone]

## 2021-01-23 ENCOUNTER — Emergency Department
Admission: EM | Admit: 2021-01-23 | Discharge: 2021-01-23 | Disposition: A | Discharge status: Home / Self Care | Payer: Managed Care, Other (non HMO) | Attending: Emergency Medicine | Admitting: Emergency Medicine

## 2021-04-22 ENCOUNTER — Ambulatory Visit
Admission: EM | Admit: 2021-04-22 | Discharge: 2021-04-22 | Disposition: A | Discharge status: Home / Self Care | Payer: BC Managed Care – PPO | Attending: Internal Medicine | Admitting: Internal Medicine

## 2021-04-22 ENCOUNTER — Other Ambulatory Visit: Payer: Self-pay

## 2021-04-22 DIAGNOSIS — N39490 Overflow incontinence: Secondary | ICD-10-CM | POA: Diagnosis not present

## 2021-04-22 DIAGNOSIS — R350 Frequency of micturition: Secondary | ICD-10-CM | POA: Diagnosis not present

## 2021-04-22 DIAGNOSIS — N401 Enlarged prostate with lower urinary tract symptoms: Secondary | ICD-10-CM | POA: Insufficient documentation

## 2021-04-22 DIAGNOSIS — R7401 Elevation of levels of liver transaminase levels: Secondary | ICD-10-CM | POA: Insufficient documentation

## 2021-04-22 DIAGNOSIS — I10 Essential (primary) hypertension: Secondary | ICD-10-CM | POA: Diagnosis not present

## 2021-04-22 LAB — CBC WITH DIFFERENTIAL/PLATELET
Abs Immature Granulocytes: 0.01 10*3/uL (ref 0.00–0.07)
Basophils Absolute: 0.1 10*3/uL (ref 0.0–0.1)
Basophils Relative: 1 %
Eosinophils Absolute: 0.2 10*3/uL (ref 0.0–0.5)
Eosinophils Relative: 3 %
HCT: 46.1 % (ref 39.0–52.0)
Hemoglobin: 15.7 g/dL (ref 13.0–17.0)
Immature Granulocytes: 0 %
Lymphocytes Relative: 27 %
Lymphs Abs: 1.9 10*3/uL (ref 0.7–4.0)
MCH: 27.6 pg (ref 26.0–34.0)
MCHC: 34.1 g/dL (ref 30.0–36.0)
MCV: 81 fL (ref 80.0–100.0)
Monocytes Absolute: 0.5 10*3/uL (ref 0.1–1.0)
Monocytes Relative: 7 %
Neutro Abs: 4.3 10*3/uL (ref 1.7–7.7)
Neutrophils Relative %: 62 %
Platelets: 206 10*3/uL (ref 150–400)
RBC: 5.69 MIL/uL (ref 4.22–5.81)
RDW: 14.1 % (ref 11.5–15.5)
WBC: 6.9 10*3/uL (ref 4.0–10.5)
nRBC: 0 % (ref 0.0–0.2)

## 2021-04-22 LAB — URINALYSIS, ROUTINE W REFLEX MICROSCOPIC
Bilirubin Urine: NEGATIVE
Glucose, UA: NEGATIVE mg/dL
Hgb urine dipstick: NEGATIVE
Ketones, ur: NEGATIVE mg/dL
Leukocytes,Ua: NEGATIVE
Nitrite: NEGATIVE
Protein, ur: NEGATIVE mg/dL
Specific Gravity, Urine: 1.02 (ref 1.005–1.030)
pH: 7 (ref 5.0–8.0)

## 2021-04-22 LAB — COMPREHENSIVE METABOLIC PANEL
ALT: 135 U/L — ABNORMAL HIGH (ref 0–44)
AST: 90 U/L — ABNORMAL HIGH (ref 15–41)
Albumin: 4.7 g/dL (ref 3.5–5.0)
Alkaline Phosphatase: 85 U/L (ref 38–126)
Anion gap: 11 (ref 5–15)
BUN: 8 mg/dL (ref 6–20)
CO2: 25 mmol/L (ref 22–32)
Calcium: 9.3 mg/dL (ref 8.9–10.3)
Chloride: 101 mmol/L (ref 98–111)
Creatinine, Ser: 0.87 mg/dL (ref 0.61–1.24)
GFR, Estimated: >60 mL/min (ref >60)
Glucose, Bld: 126 mg/dL — ABNORMAL HIGH (ref 70–99)
Potassium: 3.6 mmol/L (ref 3.5–5.1)
Sodium: 137 mmol/L (ref 135–145)
Total Bilirubin: 0.7 mg/dL (ref 0.3–1.2)
Total Protein: 7.9 g/dL (ref 6.5–8.1)

## 2021-04-22 MED ORDER — AMLODIPINE BESYLATE 5 MG PO TABS: 5.0000 mg | ORAL_TABLET | Freq: Every day | ORAL | 0 refills | Status: DC

## 2021-04-22 MED ORDER — TAMSULOSIN HCL 0.4 MG PO CAPS: 0.4000 mg | ORAL_CAPSULE | Freq: Every day | ORAL | 0 refills | Status: DC

## 2021-04-22 MED ORDER — LISINOPRIL 10 MG PO TABS: 10.0000 mg | ORAL_TABLET | Freq: Every day | ORAL | 0 refills | Status: DC

## 2021-04-22 NOTE — Discharge Instructions (Addendum)
Blood pressure is elevated today.  Prescriptions for amlodipine and lisinopril, for blood pressure, sent to pharmacy.  Please keep followup appointment with Dr Jerene Pitch in February to establish care and find the best treatment for your blood pressure.  Blood counts and chemistries were normal today; mild elevation of liver tests (transaminitis) was observed.  There are several possible causes of liver test elevation, none immediately life threatening.  Dr Parks Ranger can help monitor and evaluate the cause of this. Urinary symptoms of frequency and large volume/overflow type incontinence suggest BPH.  Prescription for tamsulosin (a prostate medicine) was sent to pharmacy to try to help with urinary symptoms; Seeing a urologist will be the most helpful next step in evaluating this symptoms and treating it.  The urologist will also be able to discuss erectile dysfunction, and treatment options for it, more completely with you.  Urine test was unremarkable today and no signs/symptoms suggesting infectious causes.  A culture is pending for completeness but I anticipate it will be negative.

## 2021-04-22 NOTE — ED Triage Notes (Signed)
Pt c/o issues with his bladder and blood pressure. Pt states that his blood pressure is high. Last reading was 170/113. Pt states that he has doctors appointment on 05/16/21.   Pt states that he can not control his bladder. Pt is having urinary urgency and frequency. Pt states that he has to go to the restroom 5 times through the night. Pt denies any burning or pain. Pt has not been taking his diuretic medication. Pt states that he has a horseshoe kidney

## 2021-04-22 NOTE — ED Provider Notes (Signed)
[facility]    CSN: [id number] Arrival date & time: 04/22/21  1302      History   Chief Complaint Chief Complaint  Patient presents with   Hypertension   Urinary Frequency        Urinary Urgency         HPI Leonard Lee is a 41 y.o. male. He has an appointment to establish primary care with Dr Althea CharonKaramalegos next month regarding history of HTN; has a longstanding history of slowly progressive urinary freq/incomplete urinary emptying, now in the last month with urinary incontinence, if he can't get to a bathroom when he feels the urge to urinate. No dysuria, no scrotal pain/swelling/skin change.  No urethral discharge.   Having some difficulty with erectile dysfunction. 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  Has gained weight. No weakness/clumsiness of legs, no change in bowel habits, no fecal incontinence.  HPI  Past Medical History:  Diagnosis Date   Hypertension     There are no problems to display for this patient.   Past Surgical History:  Procedure Laterality Date   EYE SURGERY     FINGER SURGERY         Home Medications    Prior to Admission medications   Medication Sig Start Date End Date Taking? Authorizing Provider  hydrochlorothiazide (MICROZIDE) 12.5 MG capsule Take 1 capsule (12.5 mg total) by mouth daily. 08/13/20  Yes Cook, Jayce G, DO  lisinopril (ZESTRIL) 10 MG tablet Take 1 tablet (10 mg total) by mouth daily. 04/22/21 05/22/21 Yes Isa RankinMurray, Kalinda Romaniello Wilson, MD  tamsulosin (FLOMAX) 0.4 MG CAPS capsule Take 1 capsule (0.4 mg total) by mouth daily after supper. 04/22/21  Yes Isa RankinMurray, Rogerio Boutelle Wilson, MD  amLODipine (NORVASC) 5 MG tablet Take 1 tablet (5 mg total) by mouth daily. 04/22/21 05/22/21  Isa RankinMurray, Cortlan Dolin Wilson, MD  albuterol (VENTOLIN HFA) 108 (90 Base) MCG/ACT inhaler Inhale 2 puffs into the lungs every 4 (four) hours as needed for up to 7 days for wheezing or shortness of  breath. For cough and shortness of breath 09/07/19 11/08/19  Rodriguez-Southworth, Nettie ElmSylvia, PA-C  lisinopril-hydrochlorothiazide (ZESTORETIC) 20-12.5 MG tablet lisinopril 20 mg-hydrochlorothiazide 12.5 mg tablet  08/13/20  [provider]  Currently taking amlodipine; out of the other meds and leery of taking a diuretic in the setting of current urinary difficulties.  Family History Family History  Problem Relation Age of Onset   Heart disease Father     Social History Social History   Tobacco Use   Smoking status: Every Day    Packs/day: 0.10    Types: Cigarettes   Smokeless tobacco: Never  Vaping Use   Vaping Use: Former  Substance Use Topics   Alcohol use: Yes   Drug use: Not Currently     Allergies   Patient has no known allergies.   Review of Systems Review of Systems see HPI   Physical Exam Triage Vital Signs ED Triage Vitals  Enc Vitals Group     BP 04/22/21 1315 (!) 190/105     Pulse Rate 04/22/21 1315 89     Resp 04/22/21 1315 18     Temp 04/22/21 1315 99.1 F (37.3 C)     Temp Source 04/22/21 1315 Oral     SpO2 04/22/21 1315 98 %     Weight 04/22/21 1314 (!) 364 lb (165.1 kg)     Height 04/22/21 1314 6\' 2"  (1.88 m)  Pain Score 04/22/21 1313 0     Pain Loc --     Updated Vital Signs BP (!) 193/108 (BP Location: Right Arm)    Pulse 89    Temp 99.1 F (37.3 C) (Oral)    Resp 18    Ht 6\' 2"  (1.88 m)    Wt (!) 165.1 kg    SpO2 98%    BMI 46.73 kg/m  Recheck bp at end of visit no significant change  Physical Exam Constitutional:      General: He is not in acute distress.    Appearance: He is not ill-appearing or toxic-appearing.  HENT:     Head: Atraumatic.     Mouth/Throat:     Mouth: Mucous membranes are moist.  Eyes:     Conjunctiva/sclera:     Right eye: Right conjunctiva is not injected. No exudate.    Left eye: Left conjunctiva is not injected. No exudate.    Comments: Conjugate gaze observed  Cardiovascular:     Rate and  Rhythm: Normal rate and regular rhythm.  Pulmonary:     Effort: Pulmonary effort is normal. No respiratory distress.     Breath sounds: No wheezing or rhonchi.  Genitourinary:    Comments: Rectal tone normal, prostate smoothly enlarged, firm, not boggy/tender Musculoskeletal:     Cervical back: Neck supple.     Comments: Walked into urgent care independently; able to climb on/off exam table easily/quickly, without assistance.    Skin:    General: Skin is warm and dry.     Comments: Pink, no cyanosis  Neurological:     Mental Status: He is alert.     Comments: Face symmetric; speech clear/coherent     UC Treatments / Results  Labs (all labs ordered are listed, but only abnormal results are displayed) Labs Reviewed  COMPREHENSIVE METABOLIC PANEL - Abnormal; Notable for the following components:      Result Value   Glucose, Bld 126 (*)    AST 90 (*)    ALT 135 (*)    All other components within normal limits  URINE CULTURE  URINALYSIS, ROUTINE W REFLEX MICROSCOPIC  CBC WITH DIFFERENTIAL/PLATELET  Mild transaminitis; normal CBC and chemistries.  Unremarkable UA.  Culture pending for completeness, given new urinary incontinence.  EKG NA  Radiology No results found. NA  Procedures Procedures (including critical care time) NA  Medications Ordered in UC Medications - No data to display NA   Final Clinical Impressions(s) / UC Diagnoses   Final diagnoses:  Essential hypertension  Transaminitis  Urinary incontinence, overflow  Benign prostatic hyperplasia with urinary frequency     Discharge Instructions      Blood pressure is elevated today.  Prescriptions for amlodipine and lisinopril, for blood pressure, sent to pharmacy.  Please keep followup appointment with Dr in February to establish care and find the best treatment for your blood pressure.  Blood counts and chemistries were normal today; mild elevation of liver tests (transaminitis) was observed.   There are several possible causes of liver test elevation, none immediately life threatening.  Dr March can help monitor and evaluate the cause of this. Urinary symptoms of frequency and large volume/overflow type incontinence suggest BPH.  Prescription for tamsulosin (a prostate medicine) was sent to pharmacy to try to help with urinary symptoms; Seeing a urologist will be the most helpful next step in evaluating this symptoms and treating it.  The urologist will also be able to discuss erectile dysfunction, and treatment  options for it, more completely with you.  Urine test was unremarkable today and no signs/symptoms suggesting infectious causes.  A culture is pending for completeness but I anticipate it will be negative.      ED Prescriptions     Medication Sig Dispense Auth. Provider   tamsulosin (FLOMAX) 0.4 MG CAPS capsule Take 1 capsule (0.4 mg total) by mouth daily after supper. 30 capsule Isa Rankin, MD   amLODipine (NORVASC) 5 MG tablet Take 1 tablet (5 mg total) by mouth daily. 30 tablet Isa Rankin, MD   lisinopril (ZESTRIL) 10 MG tablet Take 1 tablet (10 mg total) by mouth daily. 30 tablet Isa Rankin, MD      PDMP not reviewed this encounter.   Isa Rankin, MD 04/23/21 [phone]

## 2021-04-25 ENCOUNTER — Telehealth (HOSPITAL_COMMUNITY): Payer: Self-pay | Admitting: Emergency Medicine

## 2021-04-25 LAB — URINE CULTURE: Culture: 20000 — AB

## 2021-04-25 MED ORDER — NITROFURANTOIN MONOHYD MACRO 100 MG PO CAPS: 100.0000 mg | ORAL_CAPSULE | Freq: Two times a day (BID) | ORAL | 0 refills | Status: AC

## 2021-04-29 ENCOUNTER — Encounter: Payer: Self-pay | Admitting: Urology

## 2021-04-29 ENCOUNTER — Other Ambulatory Visit
Admission: RE | Admit: 2021-04-29 | Discharge: 2021-04-29 | Disposition: A | Discharge status: Home / Self Care | Payer: BC Managed Care – PPO | Attending: Urology | Admitting: Urology

## 2021-04-29 ENCOUNTER — Other Ambulatory Visit: Payer: Self-pay

## 2021-04-29 ENCOUNTER — Ambulatory Visit (INDEPENDENT_AMBULATORY_CARE_PROVIDER_SITE_OTHER): Payer: BC Managed Care – PPO | Admitting: Urology

## 2021-04-29 ENCOUNTER — Other Ambulatory Visit: Payer: Self-pay | Admitting: *Deleted

## 2021-04-29 VITALS — BP 168/98 | HR 87 | Ht 74.0 in | Wt 388.2 lb

## 2021-04-29 DIAGNOSIS — N3281 Overactive bladder: Secondary | ICD-10-CM

## 2021-04-29 DIAGNOSIS — N400 Enlarged prostate without lower urinary tract symptoms: Secondary | ICD-10-CM

## 2021-04-29 DIAGNOSIS — R351 Nocturia: Secondary | ICD-10-CM

## 2021-04-29 DIAGNOSIS — N401 Enlarged prostate with lower urinary tract symptoms: Secondary | ICD-10-CM

## 2021-04-29 DIAGNOSIS — N529 Male erectile dysfunction, unspecified: Secondary | ICD-10-CM | POA: Diagnosis not present

## 2021-04-29 DIAGNOSIS — N138 Other obstructive and reflux uropathy: Secondary | ICD-10-CM

## 2021-04-29 LAB — URINALYSIS, COMPLETE (UACMP) WITH MICROSCOPIC
Bacteria, UA: NONE SEEN
Bilirubin Urine: NEGATIVE
Glucose, UA: NEGATIVE mg/dL
Hgb urine dipstick: NEGATIVE
Ketones, ur: NEGATIVE mg/dL
Leukocytes,Ua: NEGATIVE
Nitrite: NEGATIVE
Protein, ur: NEGATIVE mg/dL
Specific Gravity, Urine: 1.02 (ref 1.005–1.030)
pH: 6.5 (ref 5.0–8.0)

## 2021-04-29 MED ORDER — SILDENAFIL CITRATE 100 MG PO TABS: 100.0000 mg | ORAL_TABLET | Freq: Every day | ORAL | 11 refills | Status: DC | PRN

## 2021-04-29 MED ORDER — OXYBUTYNIN CHLORIDE ER 10 MG PO TB24: 10.0000 mg | ORAL_TABLET | Freq: Every day | ORAL | 3 refills | Status: DC

## 2021-04-29 NOTE — Progress Notes (Signed)
04/29/21 3:02 PM   Leonard Lee 1981-02-22 RU:[id number]  CC: Nocturia, urgency/frequency, ED  HPI: 41 year old male with morbid obesity BMI 50 who presents with the above issues.  These have been present for at least 1 to 2 years.  He was recently seen in an urgent care with worsening urgency, frequency, and nocturia 5-10 times per night and urinalysis was benign, however culture ultimately grew 20k Enterococcus.  He was prescribed nitrofurantoin but has not yet started that medication.  He has urgency, frequency, and some urge incontinence during the day, and extremely bothersome nocturia 5-10x overnight.  He works as a Administrator.  He drinks a significant amount of caffeine including coffee in the morning, and 2 16 ounce 0 sugar energy drinks.  He denies any gross hematuria or dysuria.  He was also started on Flomax at urgent care last week which she does think has improved his urinary symptoms somewhat.  He also reports trouble with erections for the last 2 years, and has never tried medications for this.   Urinalysis today completely benign, PVR 75ml.   PMH: Past Medical History:  Diagnosis Date   Hypertension     Surgical History: Past Surgical History:  Procedure Laterality Date   EYE SURGERY     FINGER SURGERY       Family History: Family History  Problem Relation Age of Onset   Heart disease Father     Social History:  reports that he has been smoking cigarettes. He has been smoking an average of .1 packs per day. He has never used smokeless tobacco. He reports current alcohol use. He reports that he does not currently use drugs.  Physical Exam: BP (!) 168/98 (BP Location: Left Arm, Patient Position: Sitting, Cuff Size: Large)    Pulse 87    Ht 6\' 2"  (1.88 m)    Wt (!) 388 lb 3.2 oz (176.1 kg)    BMI 49.84 kg/m    Constitutional:  Alert and oriented, No acute distress. Cardiovascular: No clubbing, cyanosis, or edema. Respiratory: Normal respiratory effort, no  increased work of breathing. GI: Abdomen is soft, nontender, nondistended, no abdominal masses  Laboratory Data: Reviewed, see HPI  Assessment & Plan:   41 year old male with mixed urinary symptoms of urgency, frequency, nocturia 5-10 times overnight, urge incontinence, as well as erectile dysfunction over the last year.  We discussed that overactive bladder (OAB) is not a disease, but is a symptom complex that is generally not life-threatening.  Symptoms typically include urinary urgency, frequency, and urge incontinence.  There are numerous treatment options, however there are risks and benefits with both medical and surgical management.  First-line treatment is behavioral therapies including bladder training, pelvic floor muscle training, and fluid management.  Second line treatments include oral antimuscarinics(Ditropan er, Trospium) and beta-3 agonist (Mybetriq). There is typically a period of medication trial (4-8 weeks) to find the optimal therapy and dosing. If symptoms are bothersome despite the above management, third line options include intra-detrusor botox, peripheral tibial nerve stimulation (PTNS), and interstim (SNS). These are more invasive treatments with higher side effect profile, but may improve quality of life for patients with severe OAB symptoms.   -We discussed weight loss, avoiding bladder irritants, and I strongly encouraged him to be evaluated for sleep apnea -Recommended completing course of nitrofurantoin for culture positive Enterococcus -Oxybutynin 10 mg XL to try in 2 to 3 weeks if no significant improvement after the antibiotics -Trial of sildenafil 100 mg as needed for ED -  RTC 2 months symptom check  Nickolas Madrid, MD 04/29/2021  [place] [address] [place], [place] [address] [phone]

## 2021-04-29 NOTE — Patient Instructions (Signed)
Overactive Bladder, Adult °Overactive bladder is a condition in which a person has a sudden and frequent need to urinate. A person might also leak urine if he or she cannot get to the bathroom fast enough (urinary incontinence). Sometimes, symptoms can interfere with work or social activities. °What are the causes? °Overactive bladder is associated with poor nerve signals between your bladder and your brain. Your bladder may get the signal to empty before it is full. You may also have very sensitive muscles that make your bladder squeeze too soon. This condition may also be caused by other factors, such as: °Medical conditions: °Urinary tract infection. °Infection of nearby tissues. °Prostate enlargement. °Bladder stones, inflammation, or tumors. °Diabetes. °Muscle or nerve weakness, especially from these conditions: °A spinal cord injury. °Stroke. °Multiple sclerosis. °Parkinson's disease. °Other causes: °Surgery on the uterus or urethra. °Drinking too much caffeine or alcohol. °Certain medicines, especially those that eliminate extra fluid in the body (diuretics). °Constipation. °What increases the risk? °You may be at greater risk for overactive bladder if you: °Are an older adult. °Smoke. °Are going through menopause. °Have prostate problems. °Have a neurological disease, such as stroke, dementia, Parkinson's disease, or multiple sclerosis (MS). °Eat or drink alcohol, spicy food, caffeine, and other things that irritate the bladder. °Are overweight or obese. °What are the signs or symptoms? °Symptoms of this condition include a sudden, strong urge to urinate. Other symptoms include: °Leaking urine. °Urinating 8 or more times a day. °Waking up to urinate 2 or more times overnight. °How is this diagnosed? °This condition may be diagnosed based on: °Your symptoms and medical history. °A physical exam. °Blood or urine tests to check for possible causes, such as infection. °You may also need to see a health care  provider who specializes in urinary tract problems. This is called a urologist. °How is this treated? °Treatment for overactive bladder depends on the cause of your condition and whether it is mild or severe. Treatment may include: °Bladder training, such as: °Learning to control the urge to urinate by following a schedule to urinate at regular intervals. °Doing Kegel exercises to strengthen the pelvic floor muscles that support your bladder. °Special devices, such as: °Biofeedback. This uses sensors to help you become aware of your body's signals. °Electrical stimulation. This uses electrodes placed inside the body (implanted) or outside the body. These electrodes send gentle pulses of electricity to strengthen the nerves or muscles that control the bladder. °Women may use a plastic device, called a pessary, that fits into the vagina and supports the bladder. °Medicines, such as: °Antibiotics to treat bladder infection. °Antispasmodics to stop the bladder from releasing urine at the wrong time. °Tricyclic antidepressants to relax bladder muscles. °Injections of botulinum toxin type A directly into the bladder tissue to relax bladder muscles. °Surgery, such as: °A device may be implanted to help manage the nerve signals that control urination. °An electrode may be implanted to stimulate electrical signals in the bladder. °A procedure may be done to change the shape of the bladder. This is done only in very severe cases. °Follow these instructions at home: °Eating and drinking ° °Make diet or lifestyle changes recommended by your health care provider. These may include: °Drinking fluids throughout the day and not only with meals. °Cutting down on caffeine or alcohol. °Eating a healthy and balanced diet to prevent constipation. This may include: °Choosing foods that are high in fiber, such as beans, whole grains, and fresh fruits and vegetables. °Limiting foods that are   high in fat and processed sugars, such as fried  and sweet foods. Lifestyle  Lose weight if needed. Do not use any products that contain nicotine or tobacco. These include cigarettes, chewing tobacco, and vaping devices, such as e-cigarettes. If you need help quitting, ask your health care provider. General instructions Take over-the-counter and prescription medicines only as told by your health care provider. If you were prescribed an antibiotic medicine, take it as told by your health care provider. Do not stop taking the antibiotic even if you start to feel better. Use any implants or pessary as told by your health care provider. If needed, wear pads to absorb urine leakage. Keep a log to track how much and when you drink, and when you need to urinate. This will help your health care provider monitor your condition. Keep all follow-up visits. This is important. Contact a health care provider if: You have a fever or chills. Your symptoms do not get better with treatment. Your pain and discomfort get worse. You have more frequent urges to urinate. Get help right away if: You are not able to control your bladder. Summary Overactive bladder refers to a condition in which a person has a sudden and frequent need to urinate. Several conditions may lead to an overactive bladder. Treatment for overactive bladder depends on the cause and severity of your condition. Making lifestyle changes, doing Kegel exercises, keeping a log, and taking medicines can help with this condition. This information is not intended to replace advice given to you by your health care provider. Make sure you discuss any questions you have with your health care provider. Document Revised: 11/27/2019 Document Reviewed: 11/27/2019 Elsevier Patient Education  2022 [employer]  Sleep Apnea Sleep apnea is a condition in which breathing pauses or becomes shallow during sleep. People with sleep apnea usually snore loudly. They may have times when they gasp and stop  breathing for 10 seconds or more during sleep. This may happen many times during the night. Sleep apnea disrupts your sleep and keeps your body from getting the rest that it needs. This condition can increase your risk of certain health problems, including: Heart attack. Stroke. Obesity. Type 2 diabetes. Heart failure. Irregular heartbeat. High blood pressure. The goal of treatment is to help you breathe normally again. What are the causes? The most common cause of sleep apnea is a collapsed or blocked airway. There are three kinds of sleep apnea: Obstructive sleep apnea. This kind is caused by a blocked or collapsed airway. Central sleep apnea. This kind happens when the part of the brain that controls breathing does not send the correct signals to the muscles that control breathing. Mixed sleep apnea. This is a combination of obstructive and central sleep apnea. What increases the risk? You are more likely to develop this condition if you: Are overweight. Smoke. Have a smaller than normal airway. Are older. Are male. Drink alcohol. Take sedatives or tranquilizers. Have a family history of sleep apnea. Have a tongue or tonsils that are larger than normal. What are the signs or symptoms? Symptoms of this condition include: Trouble staying asleep. Loud snoring. Morning headaches. Waking up gasping. Dry mouth or sore throat in the morning. Daytime sleepiness and tiredness. If you have daytime fatigue because of sleep apnea, you may be more likely to have: Trouble concentrating. Forgetfulness. Irritability or mood swings. Personality changes. Feelings of depression. Sexual dysfunction. This may include loss of interest if you are male, or erectile dysfunction if you  are male. How is this diagnosed? This condition may be diagnosed with: A medical history. A physical exam. A series of tests that are done while you are sleeping (sleep study). These tests are usually done in  a sleep lab, but they may also be done at home. How is this treated? Treatment for this condition aims to restore normal breathing and to ease symptoms during sleep. It may involve managing health issues that can affect breathing, such as high blood pressure or obesity. Treatment may include: Sleeping on your side. Using a decongestant if you have nasal congestion. Avoiding the use of depressants, including alcohol, sedatives, and narcotics. Losing weight if you are overweight. Making changes to your diet. Quitting smoking. Using a device to open your airway while you sleep, such as: An oral appliance. This is a custom-made mouthpiece that shifts your lower jaw forward. A continuous positive airway pressure (CPAP) device. This device blows air through a mask when you breathe out (exhale). A nasal expiratory positive airway pressure (EPAP) device. This device has valves that you put into each nostril. A bi-level positive airway pressure (BIPAP) device. This device blows air through a mask when you breathe in (inhale) and breathe out (exhale). Having surgery if other treatments do not work. During surgery, excess tissue is removed to create a wider airway. Follow these instructions at home: Lifestyle Make any lifestyle changes that your health care provider recommends. Eat a healthy, well-balanced diet. Take steps to lose weight if you are overweight. Avoid using depressants, including alcohol, sedatives, and narcotics. Do not use any products that contain nicotine or tobacco. These products include cigarettes, chewing tobacco, and vaping devices, such as e-cigarettes. If you need help quitting, ask your health care provider. General instructions Take over-the-counter and prescription medicines only as told by your health care provider. If you were given a device to open your airway while you sleep, use it only as told by your health care provider. If you are having surgery, make sure to tell  your health care provider you have sleep apnea. You may need to bring your device with you. Keep all follow-up visits. This is important. Contact a health care provider if: The device that you received to open your airway during sleep is uncomfortable or does not seem to be working. Your symptoms do not improve. Your symptoms get worse. Get help right away if: You develop: Chest pain. Shortness of breath. Discomfort in your back, arms, or stomach. You have: Trouble speaking. Weakness on one side of your body. Drooping in your face. These symptoms may represent a serious problem that is an emergency. Do not wait to see if the symptoms will go away. Get medical help right away. Call your local emergency services (911 in the [place] Do not drive yourself to the hospital. Summary Sleep apnea is a condition in which breathing pauses or becomes shallow during sleep. The most common cause is a collapsed or blocked airway. The goal of treatment is to restore normal breathing and to ease symptoms during sleep. This information is not intended to replace advice given to you by your health care provider. Make sure you discuss any questions you have with your health care provider. Document Revised: 10/16/2020 Document Reviewed: 02/16/2020 Elsevier Patient Education  2022 [employer].

## 2021-05-16 ENCOUNTER — Other Ambulatory Visit: Payer: Self-pay

## 2021-05-16 ENCOUNTER — Ambulatory Visit (INDEPENDENT_AMBULATORY_CARE_PROVIDER_SITE_OTHER): Payer: BC Managed Care – PPO | Admitting: Family Medicine

## 2021-05-16 ENCOUNTER — Encounter: Payer: Self-pay | Admitting: Family Medicine

## 2021-05-16 ENCOUNTER — Other Ambulatory Visit: Payer: Self-pay | Admitting: Family Medicine

## 2021-05-16 VITALS — BP 182/104 | HR 85 | Ht 74.0 in | Wt 380.0 lb

## 2021-05-16 DIAGNOSIS — E291 Testicular hypofunction: Secondary | ICD-10-CM

## 2021-05-16 DIAGNOSIS — Z6841 Body Mass Index (BMI) 40.0 and over, adult: Secondary | ICD-10-CM

## 2021-05-16 DIAGNOSIS — Z7689 Persons encountering health services in other specified circumstances: Secondary | ICD-10-CM | POA: Diagnosis not present

## 2021-05-16 DIAGNOSIS — I10 Essential (primary) hypertension: Secondary | ICD-10-CM

## 2021-05-16 DIAGNOSIS — L720 Epidermal cyst: Secondary | ICD-10-CM

## 2021-05-16 MED ORDER — OLMESARTAN MEDOXOMIL 40 MG PO TABS: 40.0000 mg | ORAL_TABLET | Freq: Every day | ORAL | 0 refills | Status: DC

## 2021-05-16 MED ORDER — OLMESARTAN MEDOXOMIL 40 MG PO TABS: 40.0000 mg | ORAL_TABLET | Freq: Every day | ORAL | 1 refills | Status: DC

## 2021-05-16 MED ORDER — AMLODIPINE BESYLATE 10 MG PO TABS: 10.0000 mg | ORAL_TABLET | Freq: Every day | ORAL | 1 refills | Status: DC

## 2021-05-16 MED ORDER — AMLODIPINE BESYLATE 10 MG PO TABS: 10.0000 mg | ORAL_TABLET | Freq: Every day | ORAL | 0 refills | Status: DC

## 2021-05-16 NOTE — Assessment & Plan Note (Addendum)
Elevated BP, uncontrolled HTN off med Complicated by morbid obesity  May have underlying OSA undiagnosed/treated. Improved sleep w/ wt loss, he prefers to look into oral appliance and wt loss first before PSG.   Plan:  1. START Amlodipine 10mg  daily (dose inc from 5mg ) 2. STOP Lisinopril ACEi cough > switch to ARB Olmesartan 40mg  daily (new rx) - fam takes this same one 3. STOP HCTZ 12.5mg  for now, diuretic not good fit for him  Encourage improved lifestyle - low sodium diet, regular exercise Start monitor BP outside office, bring readings to next visit, if persistently >140/90 or new symptoms notify office sooner Follow-up 4 weeks

## 2021-05-16 NOTE — Progress Notes (Signed)
Subjective:    Patient ID: Leonard Lee, male    DOB: November 21, 1980, 41 y.o.   MRN: [id number]  Leonard Lee is a 41 y.o. male presenting on 05/16/2021 for Establish Care  Establish care with new PCP. Has relocated to [facility], and switching locations  HPI  CHRONIC HTN: Obesity BMI >48 Reports history as teenager with high BP in 200s, overweight, played football, then did exercise, and he was working on cardio and BP improved. Then overtime weight gain again and BP increased again later in age 38s. Current Meds - Amlodipine 5mg  daily, HCTZ 12.5mg  daily, Lisinopril 10mg  daily He has ACEi Cough reaction on Lisinopril.   Reports good compliance, took some meds today. Tolerating well, w/o complaints. Lifestyle: - Diet: eating whole foods, more natural, less junk, more fiber, reduced or stopped alcohol intake - Exercise: increasing exercise regimen. Denies CP, dyspnea, HA, edema, dizziness / lightheadedness  He admits concern with diuretic. In summer he is sweating a lot and in winter he urinates often. He has a huge horseshoe kidney, discovered during an MRI.  OSA - improved quality sleep, now exercising.  Posterior Neck Cyst vs Abscess Reports identified prior boil on back of neck, he had it drained before. Now larger area swelling discomfort. Noticed while doing squats  Urinary Urge Difficulty BPH LUTS Reports history of difficulty with urination at times. He took Flomax for period of time limited results. Was told slightly enlarged prostate. Tried Oxybutynin XL 10mg  but no longer on. He prefers avoid.  Erectile Dysfunction Previously on Sildenafil 100mg  PRN, does not need rx at this time.    Depression screen [facility] 2/9 05/16/2021  Decreased Interest 2  Down, Depressed, Hopeless 1  PHQ - 2 Score 3  Altered sleeping 1  Tired, decreased energy 1  Change in appetite 0  Feeling bad or failure about yourself  0  Trouble concentrating 1  Moving slowly or fidgety/restless 0  Suicidal  thoughts 0  PHQ-9 Score 6  Difficult doing work/chores Very difficult   GAD 7 : Generalized Anxiety Score 05/16/2021  Nervous, Anxious, on Edge 1  Control/stop worrying 1  Worry too much - different things 1  Trouble relaxing 2  Restless 0  Easily annoyed or irritable 2  Afraid - awful might happen 2  Total GAD 7 Score 9  Anxiety Difficulty Very difficult     Past Medical History:  Diagnosis Date   Hypertension    Past Surgical History:  Procedure Laterality Date   EYE SURGERY     FINGER SURGERY     Social History   Socioeconomic History   Marital status: Married    Spouse name: Not on file   Number of children: Not on file   Years of education: Not on file   Highest education level: Not on file  Occupational History   Not on file  Tobacco Use   Smoking status: Every Day    Packs/day: 0.10    Types: Cigarettes   Smokeless tobacco: Never  Vaping Use   Vaping Use: Former  Substance and Sexual Activity   Alcohol use: Yes   Drug use: Not Currently   Sexual activity: Not on file  Other Topics Concern   Not on file  Social History Narrative   Not on file   Social Determinants of Health   Financial Resource Strain: Not on file  Food Insecurity: Not on file  Transportation Needs: Not on file  Physical Activity: Not on file  Stress: Not  on file  Social Connections: Not on file  Intimate Partner Violence: Not on file   Family History  Problem Relation Age of Onset   Heart disease Father    Current Outpatient Medications on File Prior to Visit  Medication Sig   sildenafil (VIAGRA) 100 MG tablet Take 1 tablet (100 mg total) by mouth daily as needed for erectile dysfunction. (Patient not taking: Reported on 05/16/2021)   [DISCONTINUED] albuterol (VENTOLIN HFA) 108 (90 Base) MCG/ACT inhaler Inhale 2 puffs into the lungs every 4 (four) hours as needed for up to 7 days for wheezing or shortness of breath. For cough and shortness of breath   [DISCONTINUED]  lisinopril-hydrochlorothiazide (ZESTORETIC) 20-12.5 MG tablet lisinopril 20 mg-hydrochlorothiazide 12.5 mg tablet   No current facility-administered medications on file prior to visit.    Review of Systems Per HPI unless specifically indicated above      Objective:    BP (!) 182/104 (BP Location: Left Arm, Cuff Size: Large)    Pulse 85    Ht 6\' 2"  (1.88 m)    Wt (!) 380 lb (172.4 kg)    SpO2 97%    BMI 48.79 kg/m   Wt Readings from Last 3 Encounters:  05/16/21 (!) 380 lb (172.4 kg)  04/29/21 (!) 388 lb 3.2 oz (176.1 kg)  04/22/21 (!) 364 lb (165.1 kg)    Physical Exam Vitals and nursing note reviewed.  Constitutional:      General: He is not in acute distress.    Appearance: Normal appearance. He is well-developed. He is obese. He is not diaphoretic.     Comments: Well-appearing, comfortable, cooperative  HENT:     Head: Normocephalic and atraumatic.  Eyes:     General:        Right eye: No discharge.        Left eye: No discharge.     Conjunctiva/sclera: Conjunctivae normal.  Neck:     Comments: Large area 4 x 5 cm induration, no obvious fluctuance or nodule, but larger soft tissue density midline posterior neck upper back Cardiovascular:     Rate and Rhythm: Normal rate.  Pulmonary:     Effort: Pulmonary effort is normal.  Skin:    General: Skin is warm and dry.     Findings: No erythema or rash.  Neurological:     Mental Status: He is alert and oriented to person, place, and time.  Psychiatric:        Mood and Affect: Mood normal.        Behavior: Behavior normal.        Thought Content: Thought content normal.     Comments: Well groomed, good eye contact, normal speech and thoughts     Results for orders placed or performed during the hospital encounter of 04/29/21  Urinalysis, Complete w Microscopic  Result Value Ref Range   Color, Urine YELLOW YELLOW   APPearance CLEAR CLEAR   Specific Gravity, Urine 1.020 1.005 - 1.030   pH 6.5 5.0 - 8.0   Glucose, UA  NEGATIVE NEGATIVE mg/dL   Hgb urine dipstick NEGATIVE NEGATIVE   Bilirubin Urine NEGATIVE NEGATIVE   Ketones, ur NEGATIVE NEGATIVE mg/dL   Protein, ur NEGATIVE NEGATIVE mg/dL   Nitrite NEGATIVE NEGATIVE   Leukocytes,Ua NEGATIVE NEGATIVE   Squamous Epithelial / LPF 0-5 0 - 5   WBC, UA 0-5 0 - 5 WBC/hpf   RBC / HPF 0-5 0 - 5 RBC/hpf   Bacteria, UA NONE SEEN NONE SEEN  Assessment & Plan:   Problem List Items Addressed This Visit     Morbid obesity with body mass index (BMI) of 45.0 to 49.9 in adult [facility])   Essential hypertension    Elevated BP, uncontrolled HTN off med Complicated by morbid obesity  May have underlying OSA undiagnosed/treated. Improved sleep w/ wt loss, he prefers to look into oral appliance and wt loss first before PSG.   Plan:  1. START Amlodipine 10mg  daily (dose inc from 5mg ) 2. STOP Lisinopril ACEi cough > switch to ARB Olmesartan 40mg  daily (new rx) - fam takes this same one 3. STOP HCTZ 12.5mg  for now, diuretic not good fit for him  Encourage improved lifestyle - low sodium diet, regular exercise Start monitor BP outside office, bring readings to next visit, if persistently >140/90 or new symptoms notify office sooner Follow-up 4 weeks       Relevant Medications   amLODipine (NORVASC) 10 MG tablet   olmesartan (BENICAR) 40 MG tablet   Other Visit Diagnoses     Epidermoid cyst of neck    -  Primary   Encounter to establish care with new doctor           Establish care  Urinary dysfunction F/u in future, he declines intervention today. Consider med vs referral  Cyst of neck/back Prior drainage, now recurrence, large area, no obvious fluctuance or drainage or sign of infection. Recommend Gen Surgery eval for excision given large area if interested, he defers for now.  Sent 30 day for his BP meds today to local pharmacy, then RE ORDER 90 DAY to [facility]   Meds ordered this encounter  Medications   DISCONTD: amLODipine (NORVASC) 10  MG tablet    Sig: Take 1 tablet (10 mg total) by mouth daily.    Dispense:  30 tablet    Refill:  0   DISCONTD: olmesartan (BENICAR) 40 MG tablet    Sig: Take 1 tablet (40 mg total) by mouth daily.    Dispense:  30 tablet    Refill:  0   amLODipine (NORVASC) 10 MG tablet    Sig: Take 1 tablet (10 mg total) by mouth daily.    Dispense:  90 tablet    Refill:  1   olmesartan (BENICAR) 40 MG tablet    Sig: Take 1 tablet (40 mg total) by mouth daily.    Dispense:  90 tablet    Refill:  1      Follow up plan: Return in about 4 weeks (around 06/13/2021) for 4 weeks AM labs then 1 week later Follow-up HTN.  4 week non fasting lab only, add BMET - Testosterone in 4 weeks    Saralyn Pilar, DO [facility] 05/16/2021, 10:13 AM

## 2021-05-16 NOTE — Patient Instructions (Addendum)
Thank you for coming to the office today.  Start Olmesartan (generic benicar) 40mg  daily Start on Amlodipine 10mg  daily  30 [facility] to [facility]   Then will order 90 day to [facility]  ------------------  [facility] Address: [address] [place], [place], [place] Phone: [phone]  If there is any issue with this company, for example not covered by insurance or other problem, please notify [phone] and they may do so a well - we will need to change the location of the referral.   For mild to moderate Obstructive Sleep Apnea and/or problems with Snoring, a custom dental oral appliance may help improve breathing while sleeping and treat these problems.  Please check into these available local options to see if they are in your network and if an oral appliance is a good fit for you. If this option does not work or if you would prefer, then sleep study if you haven't done it already or CPAP machine are the next options.  [place] & [phone] [facility] [address] [place], [address] [place] P: [phone]  [facility] [address] [place], [address] [place] [phone]  [id number], DDS [address] [place], [address] [phone]  [facility] [address] [place], [address] [place] [phone]  [facility] [address] [place], [address] [place] [phone]  [facility] [address] [place], [address] [place] [phone]  DUE for FASTING BLOOD WORK (no food or drink after midnight before the lab appointment, only water or coffee without cream/sugar on the morning of)  SCHEDULE "Lab Only" visit in the morning at the clinic for lab draw in 4 WEEKS   - Make sure Lab Only appointment is at about 1 week before your next appointment, so that results will be available  For Lab Results, once available  within 2-3 days of blood draw, you can can log in to MyChart online to view your results and a brief explanation. Also, we can discuss results at next follow-up visit.    Please schedule a Follow-up Appointment to: Return in about 4 weeks (around 06/13/2021) for 4 weeks AM labs then 1 week later Follow-up HTN.  If you have any other questions or concerns, please feel free to call the office or send a message through MyChart. You may also schedule an earlier appointment if necessary.  Additionally, you may be receiving a survey about your experience at our office within a few days to 1 week by e-mail or mail. We value your feedback.  [phone], DO [facility], Saralyn Pilar

## 2021-06-08 ENCOUNTER — Other Ambulatory Visit: Payer: Self-pay | Admitting: Family Medicine

## 2021-06-08 DIAGNOSIS — I10 Essential (primary) hypertension: Secondary | ICD-10-CM

## 2021-06-10 ENCOUNTER — Telehealth: Payer: Self-pay | Admitting: *Deleted

## 2021-06-10 NOTE — Telephone Encounter (Signed)
Pt called to ask if he is continuing to use mail order for Norvasc and Benicar. Local pharmacy is requesting refills and pt has refills left on mail order. ?

## 2021-06-10 NOTE — Telephone Encounter (Signed)
Pt was called to ask if he is continuing to use mail order for Norvasc and Benicar. Local pharmacy is requesting refills and pt has refills left on mail order. ?

## 2021-06-10 NOTE — Telephone Encounter (Signed)
Wants local pharmacy ?Requested Prescriptions  ?Pending Prescriptions Disp Refills  ?? olmesartan (BENICAR) 40 MG tablet [Pharmacy Med Name: OLMESARTAN MEDOXOMIL 40 MG TAB] 90 tablet   ?  Sig: TAKE 1 TABLET BY MOUTH EVERY DAY  ?  ? Cardiovascular:  Angiotensin Receptor Blockers Failed - 06/08/2021 12:32 PM  ?  ?  Failed - Last BP in normal range  ?  BP Readings from Last 1 Encounters:  ?05/16/21 (!) 182/104  ?   ?  ?  Passed - Cr in normal range and within 180 days  ?  Creatinine, Ser  ?Date Value Ref Range Status  ?04/22/2021 0.87 0.61 - 1.24 mg/dL Final  ?   ?  ?  Passed - K in normal range and within 180 days  ?  Potassium  ?Date Value Ref Range Status  ?04/22/2021 3.6 3.5 - 5.1 mmol/L Final  ?   ?  ?  Passed - Patient is not pregnant  ?  ?  Passed - Valid encounter within last 6 months  ?  Recent Outpatient Visits   ?      ? 3 weeks ago Epidermoid cyst of neck  ? Iron, DO  ?  ?  ?Future Appointments   ?        ? In 1 week Parks Ranger, [facility], [facility]  ?  ? ?  ?  ?  ?? amLODipine (NORVASC) 10 MG tablet [Pharmacy Med Name: AMLODIPINE BESYLATE 10 MG TAB] 90 tablet   ?  Sig: TAKE 1 TABLET BY MOUTH EVERY DAY  ?  ? Cardiovascular: Calcium Channel Blockers 2 Failed - 06/08/2021 12:32 PM  ?  ?  Failed - Last BP in normal range  ?  BP Readings from Last 1 Encounters:  ?05/16/21 (!) 182/104  ?   ?  ?  Passed - Last Heart Rate in normal range  ?  Pulse Readings from Last 1 Encounters:  ?05/16/21 85  ?   ?  ?  Passed - Valid encounter within last 6 months  ?  Recent Outpatient Visits   ?      ? 3 weeks ago Epidermoid cyst of neck  ? Somers, DO  ?  ?  ?Future Appointments   ?        ? In 1 week Parks Ranger Devonne Doughty, DO [facility], [place]  ?  ? ?  ?  ?  ? ? ?

## 2021-06-13 ENCOUNTER — Other Ambulatory Visit: Payer: BC Managed Care – PPO

## 2021-06-13 ENCOUNTER — Other Ambulatory Visit: Payer: Self-pay

## 2021-06-13 DIAGNOSIS — E291 Testicular hypofunction: Secondary | ICD-10-CM | POA: Diagnosis not present

## 2021-06-13 DIAGNOSIS — I10 Essential (primary) hypertension: Secondary | ICD-10-CM

## 2021-06-14 LAB — BASIC METABOLIC PANEL WITH GFR
BUN: 17 mg/dL (ref 7–25)
CO2: 23 mmol/L (ref 20–32)
Calcium: 9.1 mg/dL (ref 8.6–10.3)
Chloride: 106 mmol/L (ref 98–110)
Creat: 1.02 mg/dL (ref 0.60–1.29)
Glucose, Bld: 95 mg/dL (ref 65–139)
Potassium: 4 mmol/L (ref 3.5–5.3)
Sodium: 138 mmol/L (ref 135–146)
eGFR: 95 mL/min/{1.73_m2} (ref >=60)

## 2021-06-14 LAB — TESTOSTERONE: Testosterone: 542 ng/dL (ref 250–827)

## 2021-06-20 ENCOUNTER — Ambulatory Visit: Payer: BC Managed Care – PPO | Admitting: Family Medicine

## 2021-06-30 ENCOUNTER — Ambulatory Visit: Payer: BC Managed Care – PPO | Admitting: Family Medicine

## 2021-07-01 ENCOUNTER — Other Ambulatory Visit: Payer: Self-pay | Admitting: *Deleted

## 2021-07-01 ENCOUNTER — Ambulatory Visit: Payer: BC Managed Care – PPO | Admitting: Urology

## 2021-07-01 DIAGNOSIS — N3281 Overactive bladder: Secondary | ICD-10-CM

## 2021-07-27 ENCOUNTER — Other Ambulatory Visit: Payer: Self-pay | Admitting: Urology

## 2021-08-04 ENCOUNTER — Ambulatory Visit
Admission: EM | Admit: 2021-08-04 | Discharge: 2021-08-04 | Disposition: A | Discharge status: Home / Self Care | Payer: BC Managed Care – PPO | Attending: Emergency Medicine | Admitting: Emergency Medicine

## 2021-08-04 DIAGNOSIS — K047 Periapical abscess without sinus: Secondary | ICD-10-CM

## 2021-08-04 MED ORDER — AMOXICILLIN-POT CLAVULANATE 875-125 MG PO TABS: 1.0000 | ORAL_TABLET | Freq: Two times a day (BID) | ORAL | 0 refills | Status: AC

## 2021-08-04 MED ORDER — IBUPROFEN 600 MG PO TABS: 600.0000 mg | ORAL_TABLET | Freq: Four times a day (QID) | ORAL | 0 refills | Status: DC | PRN

## 2021-08-04 NOTE — Discharge Instructions (Addendum)
Take the Augmentin twice daily with food for 10 days for treatment of your dental infection. ? ?Use over-the-counter Tylenol and ibuprofen for swelling and mild to moderate pain. ? ?Rinse with warm salt water, or Listerine, after each meal to remove food particles and wash away any pus that is collecting. ? ?You can try calling [facility] on [address] to discuss their cash pay dental options.  ? ?If you develop any increasing or swelling, fever, pain, or difficulty swallowing you to go to the emergency department at [facility] with a have an oral surgeon and also a dentist on-call.  ?

## 2021-08-04 NOTE — ED Provider Notes (Signed)
?[facility] ? ? ? ?CSN: [id number] ?Arrival date & time: 08/04/21  1240 ? ? ?  ? ?History   ?Chief Complaint ?Chief Complaint  ?Patient presents with  ? Dental Pain  ? ? ?HPI ?Leonard Lee is a 41 y.o. male.  ? ?HPI ? ?41 year old male here for evaluation of dental pain. ? ?Patient reports that he has been experiencing pain in his right lower rear molar for last 4 to 5 days.  He states that the tooth is broken and he has been keeping a temporary filling in place but that is now gone.  He states he does have pain when he chews and will take his pain from a 2-3 on up to a 7/10.  He denies any fever or drainage from the tooth.  He does not currently have a dentist.  He did have some leftover 875 mg amoxicillin which she has taken but he has not taken any pain medicine. ? ?Past Medical History:  ?Diagnosis Date  ? Hypertension   ? ? ?Patient Active Problem List  ? Diagnosis Date Noted  ? Essential hypertension 05/16/2021  ? Morbid obesity with body mass index (BMI) of 45.0 to 49.9 in adult [facility]) 05/16/2021  ? ? ?Past Surgical History:  ?Procedure Laterality Date  ? EYE SURGERY  1984  ? FINGER SURGERY  2004  ? ? ? ? ? ?Home Medications   ? ?Prior to Admission medications   ?Medication Sig Start Date End Date Taking? Authorizing Provider  ?amLODipine (NORVASC) 10 MG tablet TAKE 1 TABLET BY MOUTH EVERY DAY 06/10/21  Yes Karamalegos, Netta Neat, DO  ?amoxicillin-clavulanate (AUGMENTIN) 875-125 MG tablet Take 1 tablet by mouth every 12 (twelve) hours for 10 days. 08/04/21 08/14/21 Yes Becky Augusta, NP  ?ibuprofen (ADVIL) 600 MG tablet Take 1 tablet (600 mg total) by mouth every 6 (six) hours as needed. 08/04/21  Yes Becky Augusta, NP  ?olmesartan (BENICAR) 40 MG tablet TAKE 1 TABLET BY MOUTH EVERY DAY 06/10/21  Yes Karamalegos, Netta Neat, DO  ?sildenafil (VIAGRA) 100 MG tablet Take 1 tablet (100 mg total) by mouth daily as needed for erectile dysfunction. ?Patient not taking: Reported on 05/16/2021 04/29/21   Sondra Come, MD  ?albuterol (VENTOLIN HFA) 108 (90 Base) MCG/ACT inhaler Inhale 2 puffs into the lungs every 4 (four) hours as needed for up to 7 days for wheezing or shortness of breath. For cough and shortness of breath 09/07/19 11/08/19  Rodriguez-Southworth, Nettie Elm, PA-C  ?lisinopril-hydrochlorothiazide (ZESTORETIC) 20-12.5 MG tablet lisinopril 20 mg-hydrochlorothiazide 12.5 mg tablet  08/13/20  [provider]  ? ? ?Family History ?Family History  ?Problem Relation Age of Onset  ? Heart disease Father   ? ? ?Social History ?Social History  ? ?Tobacco Use  ? Smoking status: Every Day  ?  Packs/day: 0.10  ?  Types: Cigarettes  ? Smokeless tobacco: Never  ?Vaping Use  ? Vaping Use: Former  ?Substance Use Topics  ? Alcohol use: Not Currently  ?  Comment: Occ.  ? Drug use: Not Currently  ? ? ? ?Allergies   ?Patient has no known allergies. ? ? ?Review of Systems ?Review of Systems  ?Constitutional:  Negative for fever.  ?HENT:  Positive for dental problem. Negative for mouth sores.   ? ? ?Physical Exam ?Triage Vital Signs ?ED Triage Vitals  ?Enc Vitals Group  ?   BP 08/04/21 1250 (!) 147/76  ?   Pulse Rate 08/04/21 1250 91  ?   Resp 08/04/21  1250 20  ?   Temp 08/04/21 1250 98.2 ?F (36.8 ?C)  ?   Temp Source 08/04/21 1250 Oral  ?   SpO2 08/04/21 1250 96 %  ?   Weight 08/04/21 1248 (!) 355 lb (161 kg)  ?   Height 08/04/21 1248 6' 2.75" (1.899 m)  ?   Head Circumference --   ?   Peak Flow --   ?   Pain Score 08/04/21 1245 4  ?   Pain Loc --   ?   Pain Edu? --   ?   Excl. in GC? --   ? ?No data found. ? ?Updated Vital Signs ?BP (!) 147/76 (BP Location: Left Arm)   Pulse 91   Temp 98.2 ?F (36.8 ?C) (Oral)   Resp 20   Ht 6' 2.75" (1.899 m)   Wt (!) 355 lb (161 kg)   SpO2 96%   BMI 44.67 kg/m?  ? ?Visual Acuity ?Right Eye Distance:   ?Left Eye Distance:   ?Bilateral Distance:   ? ?Right Eye Near:   ?Left Eye Near:    ?Bilateral Near:    ? ?Physical Exam ?Vitals and nursing note reviewed.  ?Constitutional:   ?    Appearance: Normal appearance. He is not ill-appearing.  ?HENT:  ?   Head: Normocephalic and atraumatic.  ?   Mouth/Throat:  ?   Mouth: Mucous membranes are moist.  ?   Pharynx: Oropharynx is clear. No oropharyngeal exudate or posterior oropharyngeal erythema.  ?Musculoskeletal:  ?   Cervical back: Normal range of motion and neck supple. No tenderness.  ?Lymphadenopathy:  ?   Cervical: No cervical adenopathy.  ?Skin: ?   General: Skin is warm and dry.  ?   Capillary Refill: Capillary refill takes less than 2 seconds.  ?   Findings: No erythema or rash.  ?Neurological:  ?   General: No focal deficit present.  ?   Mental Status: He is alert and oriented to person, place, and time.  ?Psychiatric:     ?   Mood and Affect: Mood normal.     ?   Behavior: Behavior normal.     ?   Thought Content: Thought content normal.     ?   Judgment: Judgment normal.  ? ? ? ?UC Treatments / Results  ?Labs ?(all labs ordered are listed, but only abnormal results are displayed) ?Labs Reviewed - No data to display ? ?EKG ? ? ?Radiology ?No results found. ? ?Procedures ?Procedures (including critical care time) ? ?Medications Ordered in UC ?Medications - No data to display ? ?Initial Impression / Assessment and Plan / UC Course  ?I have reviewed the triage vital signs and the nursing notes. ? ?Pertinent labs & imaging results that were available during my care of the patient were reviewed by me and considered in my medical decision making (see chart for details). ? ?Patient is a nontoxic-appearing 41 year old male here for evaluation of dental pain as outlined in HPI above.  On exam patient does have a broken right rear second molar.  There is no drainage from the tooth or the surrounding gum tissue.  The surrounding tissue is also not erythematous or edematous.  Patient does have tenderness when pressing on the tooth or when pressing on the lingual side of the gum in the floor of the mouth.  There is no fluctuance or induration  appreciated.  Externally there is no submental or submandibular lymphadenopathy or fullness appreciated.  I advised patient  that he needs to see a dentist as he needs to have the tooth either repaired or pulled.  States he does not have dental insurance and cannot afford a dentist at this time.  I did advise him to go speak to [facility] on [address] as they have a tiered billing scale for [employer] payment and they may be able to help him.  I have also suggested he use the [facility].  I will place the patient on Augmentin 875 twice daily with food for 10 days and give him 60 mg ibuprofen to help with the discomfort. ? ? ?Final Clinical Impressions(s) / UC Diagnoses  ? ?Final diagnoses:  ?Dental infection  ? ? ? ?Discharge Instructions   ? ?  ?Take the Augmentin twice daily with food for 10 days for treatment of your dental infection. ? ?Use over-the-counter Tylenol and ibuprofen for swelling and mild to moderate pain. ? ?Rinse with warm salt water, or Listerine, after each meal to remove food particles and wash away any pus that is collecting. ? ?You can try calling [facility] on [address] to discuss their cash pay dental options.  ? ?If you develop any increasing or swelling, fever, pain, or difficulty swallowing you to go to the emergency [facility] at [facility] with a have an oral surgeon and also a dentist on-call.  ? ? ? ? ?ED Prescriptions   ? ? Medication Sig Dispense Auth. Provider  ? amoxicillin-clavulanate (AUGMENTIN) 875-125 MG tablet Take 1 tablet by mouth every 12 (twelve) hours for 10 days. 20 tablet Becky Augustayan, Ural Acree, NP  ? ibuprofen (ADVIL) 600 MG tablet Take 1 tablet (600 mg total) by mouth every 6 (six) hours as needed. 30 tablet Becky Augustayan, Shoni Quijas, NP  ? ?  ? ?PDMP not reviewed this encounter. ?  ?Becky Augustayan, Ardyn Forge, NP ?08/04/21 1321 ? ?

## 2021-08-04 NOTE — ED Triage Notes (Signed)
Patient is here for "gum/teeth pain". "Jaw feel's swollen". Seems to be "bottom right, in back of mouth, teeth". ? Wisdom teeth removed. No fever. No injury. Not followed by dentist right now. PCP in [place].  ?

## 2021-09-24 ENCOUNTER — Encounter: Payer: Self-pay | Admitting: Emergency Medicine

## 2021-09-24 ENCOUNTER — Ambulatory Visit
Admission: EM | Admit: 2021-09-24 | Discharge: 2021-09-24 | Disposition: A | Discharge status: Home / Self Care | Payer: BC Managed Care – PPO | Attending: Internal Medicine | Admitting: Internal Medicine

## 2021-09-24 ENCOUNTER — Other Ambulatory Visit: Payer: Self-pay

## 2021-09-24 DIAGNOSIS — M545 Low back pain, unspecified: Secondary | ICD-10-CM | POA: Diagnosis not present

## 2021-09-24 MED ORDER — METHOCARBAMOL 500 MG PO TABS: 500.0000 mg | ORAL_TABLET | Freq: Every evening | ORAL | 0 refills | Status: DC | PRN

## 2021-09-24 MED ORDER — MELOXICAM 7.5 MG PO TABS: 7.5000 mg | ORAL_TABLET | Freq: Every day | ORAL | 0 refills | Status: DC

## 2021-09-24 MED ORDER — KETOROLAC TROMETHAMINE 60 MG/2ML IM SOLN
30.0000 mg | Freq: Once | INTRAMUSCULAR | Status: AC
  Administered 2021-09-24: 30 mg via INTRAMUSCULAR

## 2021-09-24 NOTE — ED Triage Notes (Signed)
Pt c/o right sided lower back pain. Started about 2 weeks ago. He states walking makes pain worse.

## 2021-09-24 NOTE — Discharge Instructions (Signed)
Rest and elevate the affected painful area.   Heating pad use on a 20-minute on-20 minutes off cycle intermittently as needed.  As pain recedes, begin normal activities slowly as tolerated. Do not drive or operate heavy machinery after taking muscle relaxants. Return to urgent care if symptoms persist.

## 2021-09-24 NOTE — ED Provider Notes (Signed)
[facility]    CSN: [id number] Arrival date & time: 09/24/21  1511      History   Chief Complaint Chief Complaint  Patient presents with   Back Pain    HPI Leonard Lee is a 41 y.o. male comes to the urgent care with right-sided lower back pain of 2 weeks duration.  Pain is throbbing and sharp.  It is aggravated by movement and partially relieved when he takes NSAIDs or tries to massage muscle in the lower back.  Pain does not radiate to the right leg.  No weakness in both lower extremities.  Patient's job requires heavy lifting.  No recent falls or trauma. HPI  Past Medical History:  Diagnosis Date   Hypertension     Patient Active Problem List   Diagnosis Date Noted   Essential hypertension 05/16/2021   Morbid obesity with body mass index (BMI) of 45.0 to 49.9 in adult [facility]) 05/16/2021    Past Surgical History:  Procedure Laterality Date   EYE SURGERY  1984   FINGER SURGERY  2004       Home Medications    Prior to Admission medications   Medication Sig Start Date End Date Taking? Authorizing Provider  amLODipine (NORVASC) 10 MG tablet TAKE 1 TABLET BY MOUTH EVERY DAY 06/10/21  Yes Karamalegos, Netta Neat, DO  ibuprofen (ADVIL) 600 MG tablet Take 1 tablet (600 mg total) by mouth every 6 (six) hours as needed. 08/04/21  Yes Becky Augusta, NP  meloxicam (MOBIC) 7.5 MG tablet Take 1 tablet (7.5 mg total) by mouth daily. 09/24/21  Yes Makenly Larabee, Britta Mccreedy, MD  methocarbamol (ROBAXIN) 500 MG tablet Take 1 tablet (500 mg total) by mouth at bedtime as needed for muscle spasms. 09/24/21  Yes Martavius Lusty, Britta Mccreedy, MD  olmesartan (BENICAR) 40 MG tablet TAKE 1 TABLET BY MOUTH EVERY DAY 06/10/21  Yes Karamalegos, Netta Neat, DO  sildenafil (VIAGRA) 100 MG tablet Take 1 tablet (100 mg total) by mouth daily as needed for erectile dysfunction. Patient not taking: Reported on 05/16/2021 04/29/21   Sondra Come, MD  albuterol (VENTOLIN HFA) 108 (90 Base) MCG/ACT inhaler Inhale 2  puffs into the lungs every 4 (four) hours as needed for up to 7 days for wheezing or shortness of breath. For cough and shortness of breath 09/07/19 11/08/19  Rodriguez-Southworth, Nettie Elm, PA-C  lisinopril-hydrochlorothiazide (ZESTORETIC) 20-12.5 MG tablet lisinopril 20 mg-hydrochlorothiazide 12.5 mg tablet  08/13/20  [provider]    Family History Family History  Problem Relation Age of Onset   Heart disease Father     Social History Social History   Tobacco Use   Smoking status: Every Day    Packs/day: 0.10    Types: Cigarettes   Smokeless tobacco: Never  Vaping Use   Vaping Use: Former  Substance Use Topics   Alcohol use: Not Currently    Comment: Occ.   Drug use: Not Currently     Allergies   Patient has no known allergies.   Review of Systems Review of Systems  Gastrointestinal: Negative.   Musculoskeletal:  Positive for arthralgias and back pain. Negative for myalgias and neck pain.  Skin: Negative.   Hematological: Negative.   Psychiatric/Behavioral: Negative.       Physical Exam Triage Vital Signs ED Triage Vitals  Enc Vitals Group     BP 09/24/21 1554 (!) 158/97     Pulse Rate 09/24/21 1554 83     Resp 09/24/21 1554 16  Temp 09/24/21 1554 98.3 F (36.8 C)     Temp Source 09/24/21 1554 Oral     SpO2 09/24/21 1554 98 %     Weight 09/24/21 1552 (!) 354 lb 15.1 oz (161 kg)     Height 09/24/21 1552 6\' 2"  (1.88 m)     Head Circumference --      Peak Flow --      Pain Score 09/24/21 1551 8     Pain Loc --      Pain Edu? --      Excl. in GC? --    No data found.  Updated Vital Signs BP (!) 158/97 (BP Location: Right Arm)   Pulse 83   Temp 98.3 F (36.8 C) (Oral)   Resp 16   Ht 6\' 2"  (1.88 m)   Wt (!) 161 kg   SpO2 98%   BMI 45.57 kg/m   Visual Acuity Right Eye Distance:   Left Eye Distance:   Bilateral Distance:    Right Eye Near:   Left Eye Near:    Bilateral Near:     Physical Exam Vitals and nursing note reviewed.   Constitutional:      General: He is not in acute distress.    Appearance: Normal appearance. He is not ill-appearing.  Cardiovascular:     Rate and Rhythm: Normal rate and regular rhythm.  Musculoskeletal:        General: Tenderness present. No swelling. Normal range of motion.     Comments: Tenderness over the right paraspinal muscle in the lumbar region.  Neurological:     Mental Status: He is alert.      UC Treatments / Results  Labs (all labs ordered are listed, but only abnormal results are displayed) Labs Reviewed - No data to display  EKG   Radiology No results found.  Procedures Procedures (including critical care time)  Medications Ordered in UC Medications  ketorolac (TORADOL) injection 30 mg (has no administration in time range)    Initial Impression / Assessment and Plan / UC Course  I have reviewed the triage vital signs and the nursing notes.  Pertinent labs & imaging results that were available during my care of the patient were reviewed by me and considered in my medical decision making (see chart for details).     1.  Acute right-sided low back pain without sciatica: Toradol 30 mg IM x1 dose Meloxicam 7.5 mg orally daily Robaxin 500 mg at bedtime as needed for muscle spasms Heating pad use only 20 minutes on-20 minutes off cycle Gentle stretching exercises Return to urgent care if symptoms worsen. No indication for imaging at this time. Final Clinical Impressions(s) / UC Diagnoses   Final diagnoses:  Acute right-sided low back pain without sciatica     Discharge Instructions      Rest and elevate the affected painful area.   Heating pad use on a 20-minute on-20 minutes off cycle intermittently as needed.  As pain recedes, begin normal activities slowly as tolerated. Do not drive or operate heavy machinery after taking muscle relaxants. Return to urgent care if symptoms persist.    ED Prescriptions     Medication Sig Dispense Auth.  Provider   meloxicam (MOBIC) 7.5 MG tablet Take 1 tablet (7.5 mg total) by mouth daily. 30 tablet Azarie Coriz, 11/25/21, MD   methocarbamol (ROBAXIN) 500 MG tablet Take 1 tablet (500 mg total) by mouth at bedtime as needed for muscle spasms. 20 tablet Valori Hollenkamp, ,  MD      PDMP not reviewed this encounter.   Chase Picket, MD 09/24/21 [id number]

## 2021-10-08 ENCOUNTER — Other Ambulatory Visit: Payer: Self-pay | Admitting: Family Medicine

## 2021-10-08 DIAGNOSIS — I10 Essential (primary) hypertension: Secondary | ICD-10-CM

## 2021-10-09 NOTE — Telephone Encounter (Signed)
Requested Prescriptions  Pending Prescriptions Disp Refills  . amLODipine (NORVASC) 10 MG tablet [Pharmacy Med Name: AMLODIPINE BESYLATE 10 MG TAB] 90 tablet 0    Sig: TAKE 1 TABLET BY MOUTH EVERY DAY     Cardiovascular: Calcium Channel Blockers 2 Failed - 10/08/2021  5:22 PM      Failed - Last BP in normal range    BP Readings from Last 1 Encounters:  09/24/21 (!) 158/97         Passed - Last Heart Rate in normal range    Pulse Readings from Last 1 Encounters:  09/24/21 83         Passed - Valid encounter within last 6 months    Recent Outpatient Visits          4 months ago Epidermoid cyst of neck   [facility] [place], Netta Neat, DO

## 2022-01-06 IMAGING — CR DG CHEST 2V
3 series · 3 of 3 positions shown · non-contrast
Comparison: 11/08/2019

CLINICAL DATA: Chest pain

EXAM:
CHEST - 2 VIEW

[chest pa (1 of 2)]
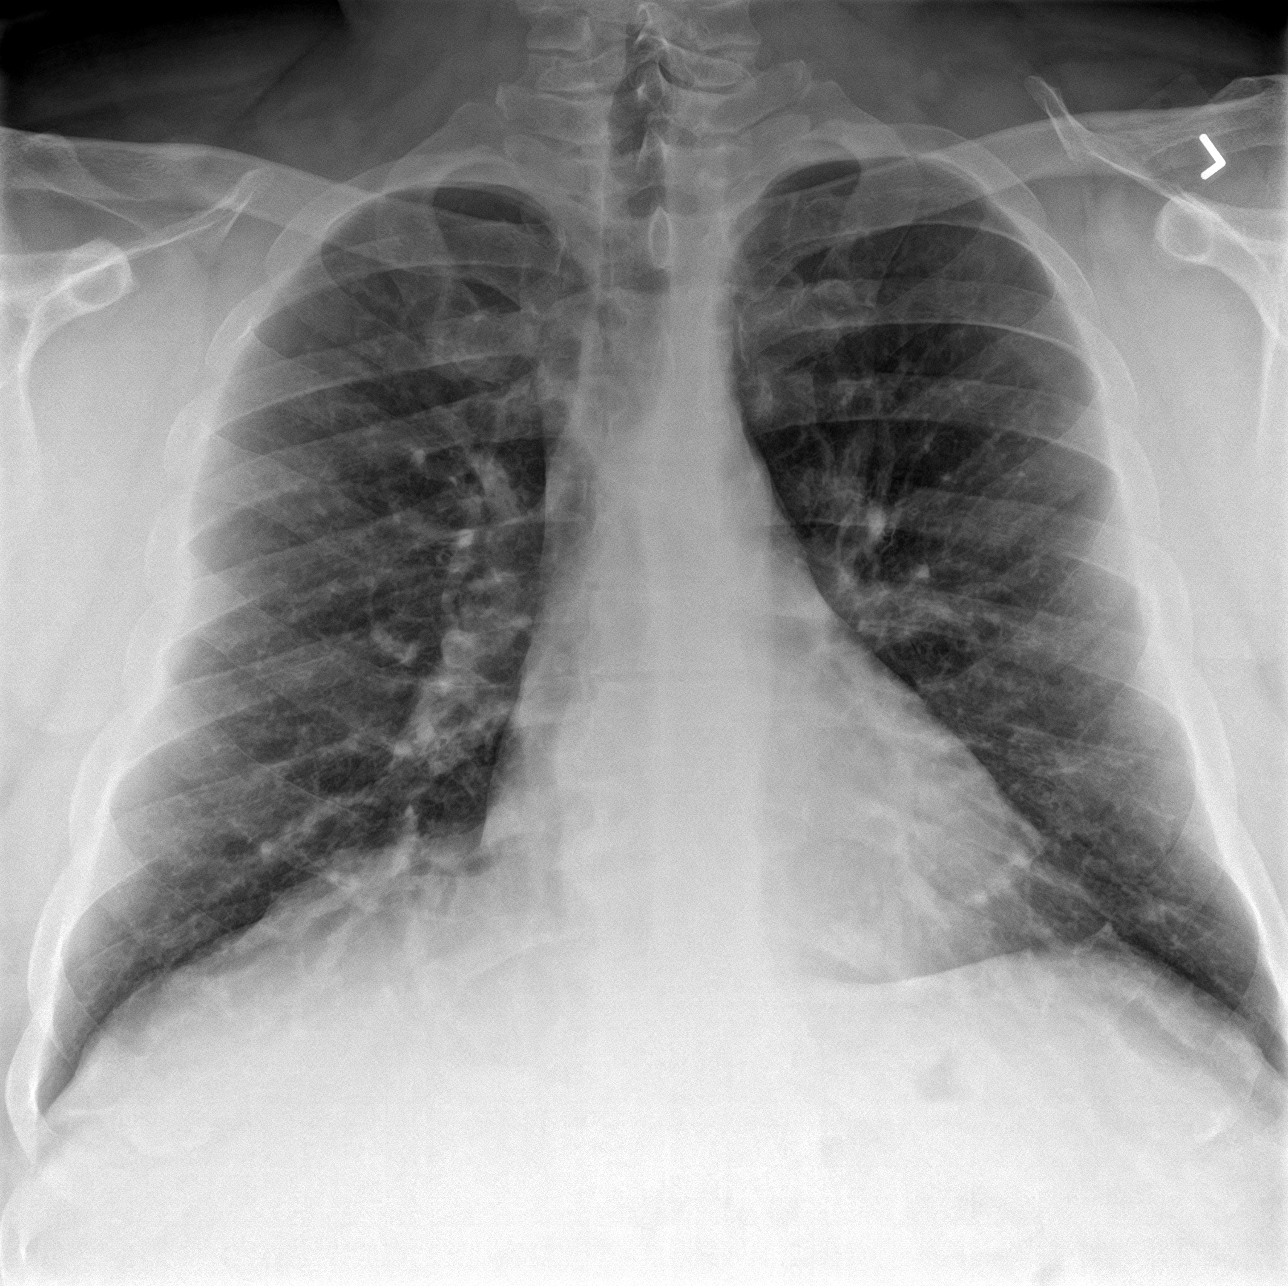

[chest lat]
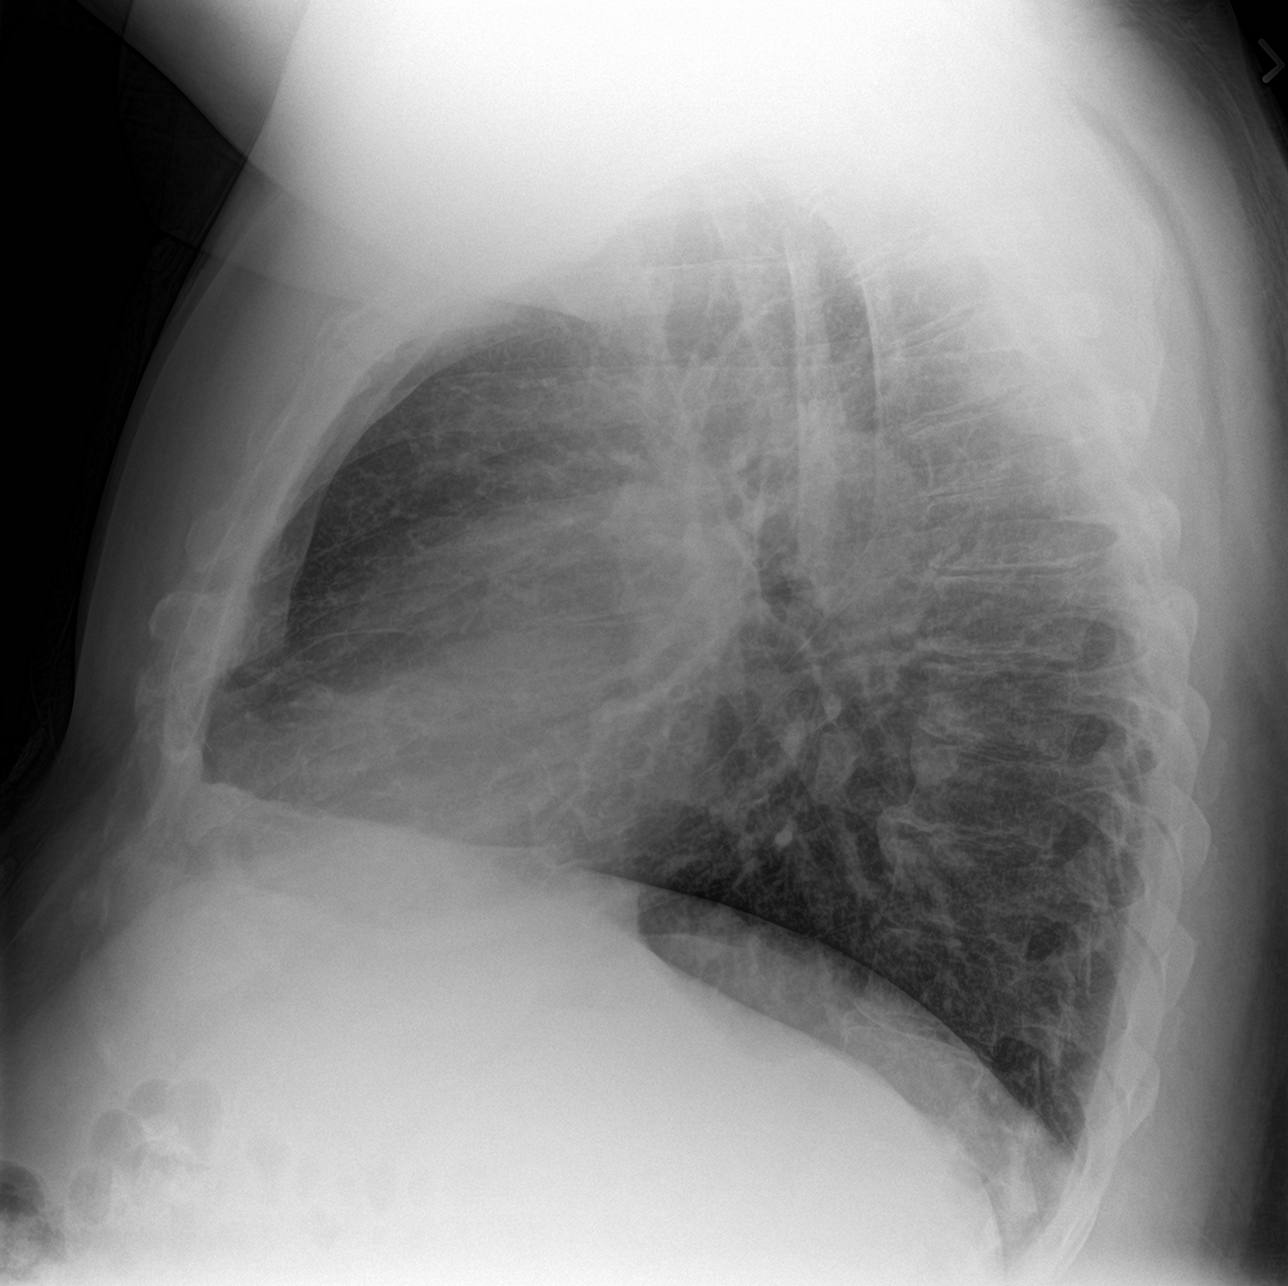

[chest pa (2 of 2)]
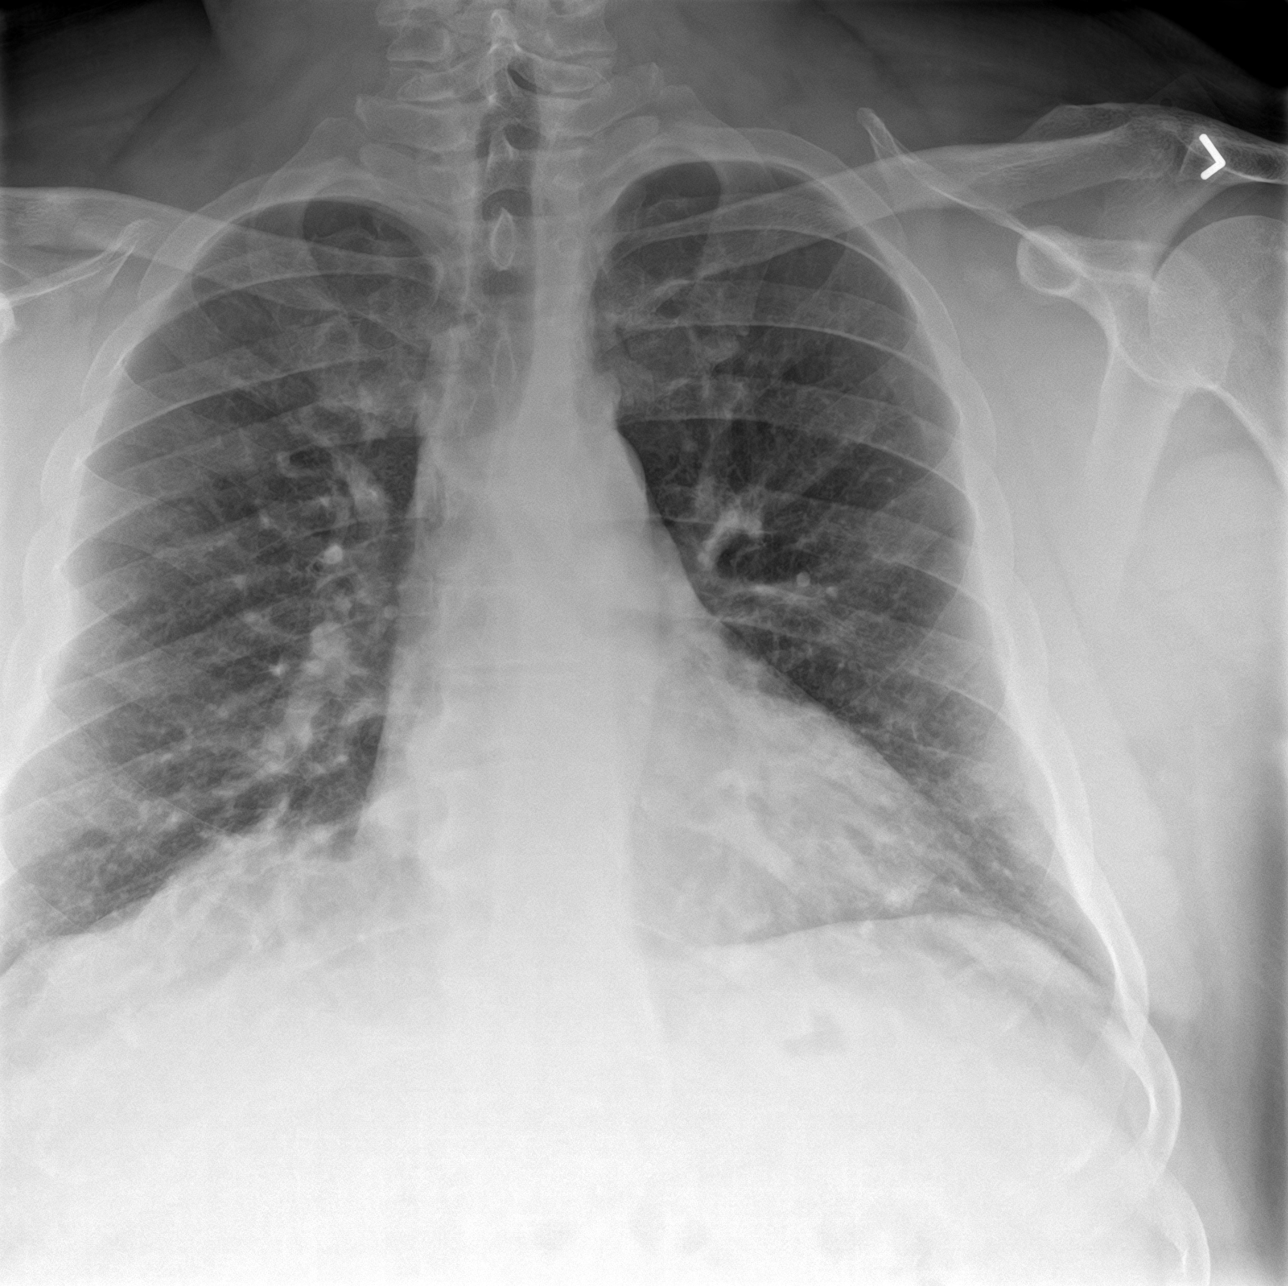

[3 of 3 positions shown; findings below may reference images not displayed]

FINDINGS: Heart and mediastinal contours are within normal limits. No focal
opacities or effusions. No acute bony abnormality.
IMPRESSION: No active cardiopulmonary disease.

## 2022-01-09 ENCOUNTER — Other Ambulatory Visit: Payer: Self-pay | Admitting: Family Medicine

## 2022-01-09 DIAGNOSIS — I10 Essential (primary) hypertension: Secondary | ICD-10-CM

## 2022-01-09 NOTE — Telephone Encounter (Signed)
Requested medication (s) are due for refill today: yes  Requested medication (s) are on the active medication list: yes  Last refill:  10/09/21  Future visit scheduled:no  Notes to clinic:  Unable to refill per protocol, appointment needed.  Routing for review.       Requested Prescriptions  Pending Prescriptions Disp Refills   amLODipine (NORVASC) 10 MG tablet [Pharmacy Med Name: AMLODIPINE BESYLATE 10 MG TAB] 90 tablet 0    Sig: TAKE 1 TABLET BY MOUTH EVERY DAY     Cardiovascular: Calcium Channel Blockers 2 Failed - 01/09/2022  1:27 AM      Failed - Last BP in normal range    BP Readings from Last 1 Encounters:  09/24/21 (!) 158/97         Failed - Valid encounter within last 6 months    Recent Outpatient Visits           7 months ago Epidermoid cyst of neck   [place], DO              Passed - Last Heart Rate in normal range    Pulse Readings from Last 1 Encounters:  09/24/21 83

## 2022-03-27 ENCOUNTER — Other Ambulatory Visit: Payer: Self-pay | Admitting: Family Medicine

## 2022-03-27 ENCOUNTER — Encounter: Payer: Self-pay | Admitting: Family Medicine

## 2022-03-27 ENCOUNTER — Ambulatory Visit (INDEPENDENT_AMBULATORY_CARE_PROVIDER_SITE_OTHER): Payer: BC Managed Care – PPO | Admitting: Family Medicine

## 2022-03-27 VITALS — BP 138/88 | HR 78 | Ht 74.0 in | Wt 370.0 lb

## 2022-03-27 DIAGNOSIS — Z125 Encounter for screening for malignant neoplasm of prostate: Secondary | ICD-10-CM

## 2022-03-27 DIAGNOSIS — I10 Essential (primary) hypertension: Secondary | ICD-10-CM | POA: Diagnosis not present

## 2022-03-27 DIAGNOSIS — R29818 Other symptoms and signs involving the nervous system: Secondary | ICD-10-CM | POA: Diagnosis not present

## 2022-03-27 DIAGNOSIS — Z0000 Encounter for general adult medical examination without abnormal findings: Secondary | ICD-10-CM

## 2022-03-27 DIAGNOSIS — R7309 Other abnormal glucose: Secondary | ICD-10-CM

## 2022-03-27 DIAGNOSIS — G4719 Other hypersomnia: Secondary | ICD-10-CM

## 2022-03-27 DIAGNOSIS — Z6841 Body Mass Index (BMI) 40.0 and over, adult: Secondary | ICD-10-CM

## 2022-03-27 DIAGNOSIS — N529 Male erectile dysfunction, unspecified: Secondary | ICD-10-CM

## 2022-03-27 MED ORDER — CONTRAVE 8-90 MG PO TB12: ORAL_TABLET | ORAL | 0 refills | Status: DC

## 2022-03-27 MED ORDER — TADALAFIL 20 MG PO TABS: 10.0000 mg | ORAL_TABLET | ORAL | 5 refills | Status: DC | PRN

## 2022-03-27 MED ORDER — OLMESARTAN MEDOXOMIL 40 MG PO TABS: 40.0000 mg | ORAL_TABLET | Freq: Every day | ORAL | 3 refills | Status: DC

## 2022-03-27 NOTE — Assessment & Plan Note (Signed)
BMI >47 Discussion on lifestyle management and now med management for weight obesity GLP1 therapy does not seem to be covered Trial on Contrave sample, then initiate titration rx, sent to [facility] mail order [facility] $99 per month F/u progress 3 months

## 2022-03-27 NOTE — Progress Notes (Signed)
Subjective:    Patient ID: Leonard Lee, male    DOB: Aug 21, 1980, 42 y.o.   MRN: [id number]  Leonard Lee is a 42 y.o. male presenting on 03/27/2022 for Sleeping Problem and Erectile Dysfunction   HPI  OSA - Patient reports prior history of dx OSA for years but not tested by sleep study Admits excessive daytime sleepiness. Admits apnea episodes witnessed Does not have CPAP, has not been tested  Epworth Sleepiness Scale Total Score: 16 Sitting and reading - 3 Watching TV - 3 Sitting inactive in a public place - 3 As a passenger in a car for an hour without a break - 3 Lying down to rest in the afternoon when circumstances permit - 3 Sitting and talking to someone - 0 Sitting quietly after a lunch without alcohol - 1 In a car, while stopped for a few minutes in traffic - 0  STOP-Bang OSA scoring Snoring yes   Tiredness yes   Observed apneas yes   Pressure HTN yes   BMI > 35 kg/m2 yes   Age > 101  no   Neck (male >17 in; Male >16 in)  yes >17"  Gender male yes   OSA risk low (0-2)  OSA risk intermediate (3-4)  OSA risk high (5+)  Total: 7 high risk     -----------------------------    CHRONIC HTN: Obesity BMI >47 Current Meds - Amlodipine 10mg  daily, Olmesartan 40mg  daily He has ACEi Cough reaction on Lisinopril.   Reports good compliance, took some meds today. Tolerating well, w/o complaints. Lifestyle: - Diet: eating whole foods, more natural, less junk, more fiber, reduced or stopped alcohol intake - Exercise: increasing exercise regimen. Denies CP, dyspnea, HA, edema, dizziness / lightheadedness     Erectile Dysfunction Trial on Sildenafil before REquest Cialis trial now       05/16/2021    9:59 AM  Depression screen PHQ 2/9  Decreased Interest 2  Down, Depressed, Hopeless 1  PHQ - 2 Score 3  Altered sleeping 1  Tired, decreased energy 1  Change in appetite 0  Feeling bad or failure about yourself  0  Trouble concentrating 1  Moving slowly or  fidgety/restless 0  Suicidal thoughts 0  PHQ-9 Score 6  Difficult doing work/chores Very difficult    Social History   Tobacco Use   Smoking status: Former    Packs/day: 0.10    Types: Cigarettes    Quit date: 03/23/2022    Years since quitting: 0.0   Smokeless tobacco: Never  Vaping Use   Vaping Use: Former  Substance Use Topics   Alcohol use: Not Currently    Comment: Occ.   Drug use: Not Currently    Review of Systems Per HPI unless specifically indicated above     Objective:    BP 138/88 (BP Location: Left Arm, Cuff Size: Large)   Pulse 78   Ht 6\' 2"  (1.88 m)   Wt (!) 370 lb (167.8 kg)   SpO2 98%   BMI 47.51 kg/m   Wt Readings from Last 3 Encounters:  03/27/22 (!) 370 lb (167.8 kg)  09/24/21 (!) 354 lb 15.1 oz (161 kg)  08/04/21 (!) 355 lb (161 kg)    Physical Exam Vitals and nursing note reviewed.  Constitutional:      General: He is not in acute distress.    Appearance: He is well-developed. He is obese. He is not diaphoretic.     Comments: Well-appearing, comfortable, cooperative  HENT:  Head: Normocephalic and atraumatic.  Eyes:     General:        Right eye: No discharge.        Left eye: No discharge.     Conjunctiva/sclera: Conjunctivae normal.  Neck:     Thyroid: No thyromegaly.  Cardiovascular:     Rate and Rhythm: Normal rate and regular rhythm.     Pulses: Normal pulses.     Heart sounds: Normal heart sounds. No murmur heard. Pulmonary:     Effort: Pulmonary effort is normal. No respiratory distress.     Breath sounds: Normal breath sounds. No wheezing or rales.  Musculoskeletal:        General: Normal range of motion.     Cervical back: Normal range of motion and neck supple.  Lymphadenopathy:     Cervical: No cervical adenopathy.  Skin:    General: Skin is warm and dry.     Findings: No erythema or rash.  Neurological:     Mental Status: He is alert and oriented to person, place, and time. Mental status is at baseline.   Psychiatric:        Behavior: Behavior normal.     Comments: Well groomed, good eye contact, normal speech and thoughts      Results for orders placed or performed in visit on 16/10/96  BASIC METABOLIC PANEL WITH GFR  Result Value Ref Range   Glucose, Bld 95 65 - 139 mg/dL   BUN 17 7 - 25 mg/dL   Creat 1.02 0.60 - 1.29 mg/dL   eGFR 95 > OR = 60 mL/min/1.35m2   BUN/Creatinine Ratio NOT APPLICABLE 6 - 22 (calc)   Sodium 138 135 - 146 mmol/L   Potassium 4.0 3.5 - 5.3 mmol/L   Chloride 106 98 - 110 mmol/L   CO2 23 20 - 32 mmol/L   Calcium 9.1 8.6 - 10.3 mg/dL  Testosterone  Result Value Ref Range   Testosterone 542 250 - 827 ng/dL      Assessment & Plan:   Problem List Items Addressed This Visit     Essential hypertension    Mild elevated BP, repeat manual Complicated by morbid obesity, OSA suspected  Plan:  Continue Amlodipine 10mg  daily, Olmesartan 40mg  daily Encourage improved lifestyle - low sodium diet, regular exercise  Upcoming future labs  Goal for diagnostic PSG for future plan to treat OSA and also Wt loss medication today      Relevant Medications   olmesartan (BENICAR) 40 MG tablet   tadalafil (CIALIS) 20 MG tablet   Morbid obesity with body mass index (BMI) of 45.0 to 49.9 in adult (HCC) - Primary    BMI >47 Discussion on lifestyle management and now med management for weight obesity GLP1 therapy does not seem to be covered Trial on Contrave sample, then initiate titration rx, sent to [facility] mail order pharmacy $99 per month F/u progress 3 months      Relevant Medications   CONTRAVE 8-90 MG TB12   Other Visit Diagnoses     Excessive daytime sleepiness       Suspected sleep apnea       Erectile dysfunction, unspecified erectile dysfunction type       Relevant Medications   tadalafil (CIALIS) 20 MG tablet       Persistent clinical concern for suspected obstructive sleep apnea given reported symptoms with witnessed apnea, snoring and sleep  disturbance, fatigue excessive sleepiness. - Screening: ESS score 16 / STOP-Bang Score 7 high risk -  Neck Circumference: >17" - Co-morbidities: HYPERTENSION, Morbid Obesity  Plan: 1. Discussion on initial diagnosis and testing for OSA, risk factors, management, complications 2. Agree to proceed with sleep study testing based on clinical concerns - will fax referral to [facility] - to arrange initial PSG home vs in sleep lab and further evaluation.   #ED Rx Tadalafil, good rx  Meds ordered this encounter  Medications   olmesartan (BENICAR) 40 MG tablet    Sig: Take 1 tablet (40 mg total) by mouth daily.    Dispense:  90 tablet    Refill:  3   CONTRAVE 8-90 MG TB12    Sig: Week 1: Take 1 tab daily with breakfast. Week 2: Take 1 tab twice daily with meal. Week 3: Take 2 tab with AM meal and 1 tab with PM meal. Week 4+: Take 2 tabs twice daily with meals - continue for weight loss    Dispense:  120 tablet    Refill:  0    [facility].   tadalafil (CIALIS) 20 MG tablet    Sig: Take 0.5-1 tablets (10-20 mg total) by mouth every other day as needed for erectile dysfunction.    Dispense:  30 tablet    Refill:  5     Follow up plan: Return in about 3 months (around 06/26/2022) for 3 month fasting lab only then 1 week later Annual Physical.  Future labs ordered for 06/2022   Saralyn Pilar, DO [facility] 03/27/2022, 11:21 AM

## 2022-03-27 NOTE — Assessment & Plan Note (Signed)
Mild elevated BP, repeat manual Complicated by morbid obesity, OSA suspected  Plan:  Continue Amlodipine 10mg  daily, Olmesartan 40mg  daily Encourage improved lifestyle - low sodium diet, regular exercise  Upcoming future labs  Goal for diagnostic PSG for future plan to treat OSA and also Wt loss medication today

## 2022-03-27 NOTE — Patient Instructions (Addendum)
Thank you for coming to the office today.  [facility] Address: [address] [place], [place], [place] [address] Phone: [phone]  Referral sent via fax today. Stay tuned to hear back from them, they should call you to schedule initial sleep study, likely a home test and then if abnormal - they will arrange the 2nd part of the sleep study in the lab with the CPAP machine.  If there is any issue with this company, for example not covered by insurance or other problem, please notify us and they may do so a well - we will need to change the location of the referral.  ----------------------  Call insurance find cost and coverage of the following - check the following: - Drug Tier, Preferred List, On Formulary - All will require a "Prior Authorization" from [place] first, before you can find out the cost - Find out if there is "Step Therapy" (other medicines required before you can try these)  Once you pick the one you want to try, let me know - we can get a sample ready IF we have it in stock. Then try it - and before running out of medicine, contact me back to order your Rx so we have time to get it processed.  For Weight Loss / Obesity only  Zepbound (new version of Mounjaro weight loss only) weekly inj  Wegovy (same as Ozempic) weekly injection  3. Saxenda - DAILY injection  4. Contrave - oral medication, appetite suppression has wellbutrin/bupropion and naltrexone in it and it can also help with appetite, it is ordered through a speciality pharmacy. - $99 a month through [place]  Future make sure your insurance has weight loss coverage  WEIGHT MANAGEMENT  Dr Dennard Nip  [facility] [place], [place] [address] Ph: [phone]  DUE for FASTING BLOOD WORK (no food or drink after midnight before the lab appointment, only water or coffee without cream/sugar on the morning of)  SCHEDULE "Lab Only" visit in  the morning at the clinic for lab draw in 3 MONTHS   - Make sure Lab Only appointment is at about 1 week before your next appointment, so that results will be available  For Lab Results, once available within 2-3 days of blood draw, you can can log in to MyChart online to view your results and a brief explanation. Also, we can discuss results at next follow-up visit.     Please schedule a Follow-up Appointment to: Return in about 3 months (around 06/26/2022) for 3 month fasting lab only then 1 week later Annual Physical.  If you have any other questions or concerns, please feel free to call the office or send a message through [place]. You may also schedule an earlier appointment if necessary.  Additionally, you may be receiving a survey about your experience at our office within a few days to 1 week by e-mail or mail. We value your feedback.  Nobie Putnam, DO [place]

## 2022-06-29 ENCOUNTER — Ambulatory Visit
Admission: EM | Admit: 2022-06-29 | Discharge: 2022-06-29 | Disposition: A | Discharge status: Home / Self Care | Payer: BC Managed Care – PPO | Attending: Physician Assistant | Admitting: Physician Assistant

## 2022-06-29 ENCOUNTER — Encounter: Payer: Self-pay | Admitting: Emergency Medicine

## 2022-06-29 DIAGNOSIS — H5789 Other specified disorders of eye and adnexa: Secondary | ICD-10-CM | POA: Diagnosis not present

## 2022-06-29 DIAGNOSIS — S0501XA Injury of conjunctiva and corneal abrasion without foreign body, right eye, initial encounter: Secondary | ICD-10-CM | POA: Diagnosis not present

## 2022-06-29 MED ORDER — KETOROLAC TROMETHAMINE 0.5 % OP SOLN: 1.0000 [drp] | Freq: Four times a day (QID) | OPHTHALMIC | 0 refills | Status: DC | PRN

## 2022-06-29 MED ORDER — CIPROFLOXACIN HCL 0.3 % OP SOLN: OPHTHALMIC | 0 refills | Status: DC

## 2022-06-29 NOTE — ED Provider Notes (Signed)
[facility]    CSN: [id number] Arrival date & time: 06/29/22  5638      History   Chief Complaint Chief Complaint  Patient presents with   Eye Problem    right    HPI Daquon Ziemba is a 42 y.o. male presenting for right eye redness, clear watery drainage, photophobia.  Symptoms began last night and worsened this morning.  He says it felt like there is swelling in his eye yesterday.  He reports rubbing his eye.  He says he does wear contact lenses.  He has since taken his contact lens out.  Reports using saline drops which caused increased burning.  Denies any vision changes, headaches, vomiting or dizziness.  No other concerns.  HPI  Past Medical History:  Diagnosis Date   Hypertension     Patient Active Problem List   Diagnosis Date Noted   Essential hypertension 05/16/2021   Morbid obesity with body mass index (BMI) of 45.0 to 49.9 in adult 05/16/2021    Past Surgical History:  Procedure Laterality Date   EYE SURGERY  1984   FINGER SURGERY  2004       Home Medications    Prior to Admission medications   Medication Sig Start Date End Date Taking? Authorizing Provider  amLODipine (NORVASC) 10 MG tablet TAKE 1 TABLET BY MOUTH EVERY DAY 01/09/22  Yes Karamalegos, Netta Neat, DO  ciprofloxacin (CILOXAN) 0.3 % ophthalmic solution Administer 1 drop, every 2 hours, while awake, for 2 days. Then 1 drop, every 4 hours, while awake, for the next 5 days. 06/29/22  Yes Shirlee Latch, PA-C  ketorolac (ACULAR) 0.5 % ophthalmic solution Place 1 drop into both eyes 4 (four) times daily as needed (for pain). 06/29/22  Yes Shirlee Latch, PA-C  olmesartan (BENICAR) 40 MG tablet Take 1 tablet (40 mg total) by mouth daily. 03/27/22  Yes Karamalegos, Alexander J, DO  CONTRAVE 8-90 MG TB12 Week 1: Take 1 tab daily with breakfast. Week 2: Take 1 tab twice daily with meal. Week 3: Take 2 tab with AM meal and 1 tab with PM meal. Week 4+: Take 2 tabs twice daily with meals - continue  for weight loss 03/27/22   Karamalegos, Netta Neat, DO  ibuprofen (ADVIL) 600 MG tablet Take 1 tablet (600 mg total) by mouth every 6 (six) hours as needed. 08/04/21   Becky Augusta, NP  meloxicam (MOBIC) 7.5 MG tablet Take 1 tablet (7.5 mg total) by mouth daily. 09/24/21   Lamptey, Britta Mccreedy, MD  methocarbamol (ROBAXIN) 500 MG tablet Take 1 tablet (500 mg total) by mouth at bedtime as needed for muscle spasms. 09/24/21   Lamptey, Britta Mccreedy, MD  tadalafil (CIALIS) 20 MG tablet Take 0.5-1 tablets (10-20 mg total) by mouth every other day as needed for erectile dysfunction. 03/27/22   Karamalegos, Netta Neat, DO  albuterol (VENTOLIN HFA) 108 (90 Base) MCG/ACT inhaler Inhale 2 puffs into the lungs every 4 (four) hours as needed for up to 7 days for wheezing or shortness of breath. For cough and shortness of breath 09/07/19 11/08/19  Rodriguez-Southworth, Nettie Elm, PA-C  lisinopril-hydrochlorothiazide (ZESTORETIC) 20-12.5 MG tablet lisinopril 20 mg-hydrochlorothiazide 12.5 mg tablet  08/13/20  [provider]    Family History Family History  Problem Relation Age of Onset   Heart disease Father     Social History Social History   Tobacco Use   Smoking status: Former    Packs/day: .1    Types: Cigarettes  Quit date: 03/23/2022    Years since quitting: 0.2   Smokeless tobacco: Never  Vaping Use   Vaping Use: Former  Substance Use Topics   Alcohol use: Not Currently    Comment: Occ.   Drug use: Not Currently     Allergies   Patient has no known allergies.   Review of Systems Review of Systems  Constitutional:  Negative for fatigue and fever.  HENT:  Negative for congestion and facial swelling.   Eyes:  Positive for photophobia, pain and redness. Negative for discharge, itching and visual disturbance.  Respiratory:  Negative for cough.   Neurological:  Negative for headaches.     Physical Exam Triage Vital Signs ED Triage Vitals  Enc Vitals Group     BP      Pulse      Resp       Temp      Temp src      SpO2      Weight      Height      Head Circumference      Peak Flow      Pain Score      Pain Loc      Pain Edu?      Excl. in GC?    No data found.  Updated Vital Signs BP (!) 164/94 (BP Location: Right Arm)   Pulse 82   Temp 98.5 F (36.9 C) (Oral)   Resp 18   Ht 6\' 2"  (1.88 m)   Wt (!) 369 lb 14.9 oz (167.8 kg)   SpO2 95%   BMI 47.50 kg/m   Visual Acuity Right Eye Distance: 20/30 uncorrected Left Eye Distance: 20/30 uncorrected Bilateral Distance: 20/30 uncorrected  Physical Exam Vitals and nursing note reviewed.  Constitutional:      General: He is not in acute distress.    Appearance: Normal appearance. He is well-developed. He is not ill-appearing.  HENT:     Head: Normocephalic and atraumatic.     Nose: Nose normal.     Mouth/Throat:     Mouth: Mucous membranes are moist.     Pharynx: Oropharynx is clear.  Eyes:     General: Lids are normal. Lids are everted, no foreign bodies appreciated. Vision grossly intact. No scleral icterus.       Right eye: No foreign body.     Conjunctiva/sclera:     Right eye: Right conjunctiva is injected.     Pupils:     Right eye: Corneal abrasion (2 small abrasions as indicated in picture.) present.   Cardiovascular:     Rate and Rhythm: Normal rate.  Pulmonary:     Effort: Pulmonary effort is normal. No respiratory distress.  Musculoskeletal:     Cervical back: Neck supple.  Skin:    General: Skin is warm and dry.     Capillary Refill: Capillary refill takes less than 2 seconds.  Neurological:     General: No focal deficit present.     Mental Status: He is alert. Mental status is at baseline.     Motor: No weakness.     Gait: Gait normal.  Psychiatric:        Mood and Affect: Mood normal.        Behavior: Behavior normal.      UC Treatments / Results  Labs (all labs ordered are listed, but only abnormal results are displayed) Labs Reviewed - No data to  display  EKG   Radiology No results  found.  Procedures Procedures (including critical care time)  Medications Ordered in UC Medications - No data to display  Initial Impression / Assessment and Plan / UC Course  I have reviewed the triage vital signs and the nursing notes.  Pertinent labs & imaging results that were available during my care of the patient were reviewed by me and considered in my medical decision making (see chart for details).   42 year old male presents for right eye redness, pain and clear watery drainage.  He does wear contact lenses.  On exam he has 2 small corneal abrasions and clear watery drainage with photophobia.  Consistent with corneal lymph abrasion.  Will cover him prophylactically with Ciloxan.  Also sent ketorolac eyedrops for pain relief and advised use of over-the-counter redness relief eyedrops, cool compresses.  Reviewed return and ER precautions.   Final Clinical Impressions(s) / UC Diagnoses   Final diagnoses:  Abrasion of right cornea, initial encounter  Eye irritation     Discharge Instructions      -Corneal abrasion likely related to scratch from contact lens. -I sent an antibiotic eyedrop to try to prevent infection but if you do notice discolored drainage, please return for reevaluation. - I sent ketorolac eyedrops to help with pain relief.  You can also use over-the-counter redness relief eyedrops and take ibuprofen and Tylenol.  Cool compresses also recommended. - If at any point your symptoms acutely worsen you have vision changes, go to the ER.     ED Prescriptions     Medication Sig Dispense Auth. Provider   ciprofloxacin (CILOXAN) 0.3 % ophthalmic solution Administer 1 drop, every 2 hours, while awake, for 2 days. Then 1 drop, every 4 hours, while awake, for the next 5 days. 5 mL Eusebio FriendlyEaves, Marqueta Pulley B, PA-C   ketorolac (ACULAR) 0.5 % ophthalmic solution Place 1 drop into both eyes 4 (four) times daily as needed (for pain). 5 mL  Shirlee LatchEaves, Wray Goehring B, PA-C      PDMP not reviewed this encounter.   Shirlee Latchaves, Doyle Kunath B, PA-C 06/29/22 [phone]

## 2022-06-29 NOTE — ED Triage Notes (Signed)
Pt c/o right eye pain, watering, and redness. Started this morning. He states it is a burning sensation and feels like he cut his eye.

## 2022-06-29 NOTE — Discharge Instructions (Addendum)
-  Corneal abrasion likely related to scratch from contact lens. -I sent an antibiotic eyedrop to try to prevent infection but if you do notice discolored drainage, please return for reevaluation. - I sent ketorolac eyedrops to help with pain relief.  You can also use over-the-counter redness relief eyedrops and take ibuprofen and Tylenol.  Cool compresses also recommended. - If at any point your symptoms acutely worsen you have vision changes, go to the ER.

## 2022-11-02 ENCOUNTER — Ambulatory Visit: Payer: Self-pay | Admitting: *Deleted

## 2022-11-02 ENCOUNTER — Other Ambulatory Visit: Payer: Self-pay | Admitting: *Deleted

## 2022-11-02 DIAGNOSIS — I10 Essential (primary) hypertension: Secondary | ICD-10-CM

## 2022-11-02 NOTE — Telephone Encounter (Signed)
FYI

## 2022-11-02 NOTE — Telephone Encounter (Signed)
noted 

## 2022-11-02 NOTE — Telephone Encounter (Signed)
Summary: poss resp infection   Pt called with possible resp infection and needing refills on his meds.  There were no appts available until the third week of August.     please advise   [phone]           Chief Complaint: sinus pain congestion, medication refill request Symptoms: sinus pain head and upper teeth, blowing nose for darkish green mucus. Can breathe through nose now. Cough, hearing issues sounds full. Out of HTn medications x couple of days  Frequency: 1 week  Pertinent Negatives: Patient denies chest pain no difficulty breathing no fever Disposition: [] ED /[] Urgent Care (no appt availability in office) / [x] Appointment(In office/virtual)/ []  [facility]/ [] Home Care/ [] Refused Recommended Disposition /[] [facility]/ []  Follow-up with PCP Additional Notes:   No available appt with PCP. Scheduled appt for tomorrow with E. Mecum, PA at [facility].  Patient aware of location of appt  Please advise regarding medication refills.     Reason for Disposition  Earache  Answer Assessment - Initial Assessment Questions 1. LOCATION: "Where does it hurt?"      Upper part of teeth and head 2. ONSET: "When did the sinus pain start?"  (e.g., hours, days)      1 week  3. SEVERITY: "How bad is the pain?"   (Scale 1-10; mild, moderate or severe)   - MILD (1-3): doesn't interfere with normal activities    - MODERATE (4-7): interferes with normal activities (e.g., work or school) or awakens from sleep   - SEVERE (8-10): excruciating pain and patient unable to do any normal activities        Severe at times  4. RECURRENT SYMPTOM: "Have you ever had sinus problems before?" If Yes, ask: "When was the last time?" and "What happened that time?"      Yes as a "kid" 5. NASAL CONGESTION: "Is the nose blocked?" If Yes, ask: "Can you open it or must you breathe through your mouth?"     Can breathe through nose now but has been an issues  6. NASAL DISCHARGE: "Do you have  discharge from your nose?" If so ask, "What color?"     Yes darkish green  7. FEVER: "Do you have a fever?" If Yes, ask: "What is it, how was it measured, and when did it start?"      No  8. OTHER SYMPTOMS: "Do you have any other symptoms?" (e.g., sore throat, cough, earache, difficulty breathing)     Cough , nasal congestion , ear pain  and out of regular medications 9. PREGNANCY: "Is there any chance you are pregnant?" "When was your last menstrual period?"     na  Protocols used: Sinus Pain or Congestion-A-AH

## 2022-11-03 ENCOUNTER — Ambulatory Visit (INDEPENDENT_AMBULATORY_CARE_PROVIDER_SITE_OTHER): Payer: BC Managed Care – PPO | Admitting: Physician Assistant

## 2022-11-03 ENCOUNTER — Encounter: Payer: Self-pay | Admitting: Physician Assistant

## 2022-11-03 ENCOUNTER — Ambulatory Visit: Payer: BC Managed Care – PPO | Admitting: Physician Assistant

## 2022-11-03 VITALS — BP 157/81 | HR 73 | Ht 74.0 in | Wt 372.0 lb

## 2022-11-03 DIAGNOSIS — Z87891 Personal history of nicotine dependence: Secondary | ICD-10-CM

## 2022-11-03 DIAGNOSIS — J069 Acute upper respiratory infection, unspecified: Secondary | ICD-10-CM

## 2022-11-03 DIAGNOSIS — I10 Essential (primary) hypertension: Secondary | ICD-10-CM

## 2022-11-03 MED ORDER — AMLODIPINE BESYLATE 10 MG PO TABS
10.0000 mg | ORAL_TABLET | Freq: Every day | ORAL | 0 refills | Status: DC
Start: 2022-11-03 — End: 2022-12-15

## 2022-11-03 NOTE — Patient Instructions (Signed)

## 2022-11-03 NOTE — Assessment & Plan Note (Signed)
Chronic, hx condition Appears a bit exacerbated today as BP is elevated in office. He states he ran out of his BP medications a few days ago Reviewed that he has a year supply of Olmesartan available and needs to contact pharmacy for refills Will send in script for 60 days supply of Amlodipine and recommend he sees PCP for regular follow up  Recommend he checks BP at home regularly for monitoring Follow up as needed for persistent or progressing symptoms

## 2022-11-03 NOTE — Progress Notes (Signed)
Acute Office Visit   Patient: Leonard Lee   DOB: October 29, 1980   42 y.o. Male  MRN: [id number] Visit Date: 11/03/2022  Today's healthcare provider: Oswaldo Conroy , PA-C  Introduced myself to the patient as a Secondary school teacher and provided education on APPs in clinical practice.    Chief Complaint  Patient presents with   Sinus Problem    Patient says he has been sick for about a week. Patient says he has serious sinus pain in his mouth and his head. Patient says he has tried over the counter Mucinex and Tylenol. Patient says sometimes when he squeezes or blow his nose, his head will puff up and have pressure in the ears. Patient says it feels like they are popping.    Cough   Ear Problem   Medication Refill    Patient says he is out of his Blood Pressure medication. Patient says he has been without his medication for about 3 days.    Sleeping Problem    Patient says he would like to have a sleep study, as he notices some issues with sleeping.    Subjective    HPI HPI     Sinus Problem    Additional comments: Patient says he has been sick for about a week. Patient says he has serious sinus pain in his mouth and his head. Patient says he has tried over the counter Mucinex and Tylenol. Patient says sometimes when he squeezes or blow his nose, his head will puff up and have pressure in the ears. Patient says it feels like they are popping.         Medication Refill    Additional comments: Patient says he is out of his Blood Pressure medication. Patient says he has been without his medication for about 3 days.         Sleeping Problem    Additional comments: Patient says he would like to have a sleep study, as he notices some issues with sleeping.       Last edited by Malen Gauze, CMA on 11/03/2022  8:48 AM.      Sinus concerns  He reports he has had sinus pain, pressure, ear pain since last Thurs He states he is tired and has had trouble sleeping  He has had mild postnasal  drainage and coughing   Interventions: Mucinex DM - these have helped with mucus, Ibuprofen  He reports his son has been sick recently with similar symptoms He has not tested for COVID at home   His BP is elevated today He reports he has not had his medications for the past few days    Medications: Outpatient Medications Prior to Visit  Medication Sig   ibuprofen (ADVIL) 600 MG tablet Take 1 tablet (600 mg total) by mouth every 6 (six) hours as needed.   olmesartan (BENICAR) 40 MG tablet Take 1 tablet (40 mg total) by mouth daily.   [DISCONTINUED] amLODipine (NORVASC) 10 MG tablet TAKE 1 TABLET BY MOUTH EVERY DAY   ciprofloxacin (CILOXAN) 0.3 % ophthalmic solution Administer 1 drop, every 2 hours, while awake, for 2 days. Then 1 drop, every 4 hours, while awake, for the next 5 days. (Patient not taking: Reported on 11/03/2022)   CONTRAVE 8-90 MG TB12 Week 1: Take 1 tab daily with breakfast. Week 2: Take 1 tab twice daily with meal. Week 3: Take 2 tab with AM meal and 1 tab with PM meal. Week 4+:  Take 2 tabs twice daily with meals - continue for weight loss (Patient not taking: Reported on 11/03/2022)   ketorolac (ACULAR) 0.5 % ophthalmic solution Place 1 drop into both eyes 4 (four) times daily as needed (for pain). (Patient not taking: Reported on 11/03/2022)   meloxicam (MOBIC) 7.5 MG tablet Take 1 tablet (7.5 mg total) by mouth daily. (Patient not taking: Reported on 11/03/2022)   methocarbamol (ROBAXIN) 500 MG tablet Take 1 tablet (500 mg total) by mouth at bedtime as needed for muscle spasms. (Patient not taking: Reported on 11/03/2022)   tadalafil (CIALIS) 20 MG tablet Take 0.5-1 tablets (10-20 mg total) by mouth every other day as needed for erectile dysfunction. (Patient not taking: Reported on 11/03/2022)   No facility-administered medications prior to visit.    Review of Systems  Constitutional:  Positive for fatigue. Negative for chills and fever.  HENT:  Positive for congestion,  ear pain, postnasal drip, sinus pressure, sinus pain and sore throat.   Respiratory:  Positive for cough.   Gastrointestinal:  Negative for diarrhea, nausea and vomiting.  Musculoskeletal:  Positive for myalgias.  Neurological:  Positive for light-headedness and headaches. Negative for dizziness.  Psychiatric/Behavioral:  Positive for sleep disturbance.         Objective    BP (!) 157/81   Pulse 73   Ht 6\' 2"  (1.88 m)   Wt (!) 372 lb (168.7 kg)   SpO2 96%   BMI 47.76 kg/m     Physical Exam Vitals reviewed.  Constitutional:      General: He is awake.     Appearance: Normal appearance. He is well-developed and well-groomed.  HENT:     Head: Normocephalic and atraumatic.     Right Ear: Hearing, tympanic membrane and ear canal normal.     Left Ear: Hearing, tympanic membrane and ear canal normal.     Mouth/Throat:     Lips: Pink.     Mouth: Mucous membranes are moist.     Pharynx: Uvula midline. Posterior oropharyngeal erythema and postnasal drip present. No pharyngeal swelling, oropharyngeal exudate or uvula swelling.     Tonsils: No tonsillar exudate or tonsillar abscesses.  Cardiovascular:     Rate and Rhythm: Normal rate and regular rhythm.     Pulses: Normal pulses.     Heart sounds: Normal heart sounds. No murmur heard.    No friction rub. No gallop.  Pulmonary:     Effort: Pulmonary effort is normal.     Breath sounds: Normal breath sounds. No decreased air movement. No decreased breath sounds, wheezing, rhonchi or rales.  Musculoskeletal:     Cervical back: Normal range of motion and neck supple.  Lymphadenopathy:     Head:     Right side of head: No submental, submandibular or preauricular adenopathy.     Left side of head: No submental, submandibular or preauricular adenopathy.     Cervical:     Right cervical: No superficial or posterior cervical adenopathy.    Left cervical: No superficial or posterior cervical adenopathy.     Upper Body:     Right upper  body: No supraclavicular adenopathy.     Left upper body: No supraclavicular adenopathy.  Neurological:     Mental Status: He is alert.  Psychiatric:        Behavior: Behavior is cooperative.       No results found for any visits on 11/03/22.  Assessment & Plan      Return in about  6 weeks (around 12/15/2022) for Follow up with PCP- sleep, HTN.      Problem List Items Addressed This Visit       Cardiovascular and Mediastinum   Essential hypertension    Chronic, hx condition Appears a bit exacerbated today as BP is elevated in office. He states he ran out of his BP medications a few days ago Reviewed that he has a year supply of Olmesartan available and needs to contact pharmacy for refills Will send in script for 60 days supply of Amlodipine and recommend he sees PCP for regular follow up  Recommend he checks BP at home regularly for monitoring Follow up as needed for persistent or progressing symptoms        Relevant Medications   amLODipine (NORVASC) 10 MG tablet   Other Visit Diagnoses     Viral upper respiratory tract infection    -  Primary Visit with patient indicates symptoms comprised of sore throat, nasal congestion, cough, fatigue since Thursday congruent with acute URI that is likely viral in nature  Patient is outside therapeutic window for antivirals- no flu or COVID testing performed today.  Due to nature and duration of symptoms recommended treatment regimen is symptomatic relief and follow up if needed Discussed with patient the various viral and bacterial etiologies of current illness and appropriate course of treatment Discussed OTC medication options for multisymptom relief such as Dayquil/Nyquil, Theraflu, AlkaSeltzer, etc. Discussed return precautions if symptoms are not improving or worsen over next 5-7 days.          Return in about 6 weeks (around 12/15/2022) for Follow up with PCP- sleep, HTN.   I,  E , PA-C, have reviewed all  documentation for this visit. The documentation on 11/03/22 for the exam, diagnosis, procedures, and orders are all accurate and complete.   Jacquelin Hawking, MHS, PA-C [facility]

## 2022-11-04 NOTE — Telephone Encounter (Signed)
Too soon 03/27/22 #90, 3RF Requested Prescriptions  Pending Prescriptions Disp Refills   olmesartan (BENICAR) 40 MG tablet 90 tablet 3    Sig: Take 1 tablet (40 mg total) by mouth daily.     Cardiovascular:  Angiotensin Receptor Blockers Failed - 11/02/2022 12:30 PM      Failed - Cr in normal range and within 180 days    Creat  Date Value Ref Range Status  06/13/2021 1.02 0.60 - 1.29 mg/dL Final         Failed - K in normal range and within 180 days    Potassium  Date Value Ref Range Status  06/13/2021 4.0 3.5 - 5.3 mmol/L Final         Failed - Last BP in normal range    BP Readings from Last 1 Encounters:  11/03/22 (!) 157/81         Failed - Valid encounter within last 6 months    Recent Outpatient Visits           Yesterday Viral upper respiratory tract infection   [facility] Mecum, Erin E, PA-C   7 months ago Morbid obesity with body mass index (BMI) of 45.0 to 49.9 in adult [facility])   [facility] Smitty Cords, DO   1 year ago Epidermoid cyst of neck   [facility] Smitty Cords, DO       Future Appointments             In 1 month Althea Charon, Netta Neat, DO [facility]            Passed - Patient is not pregnant

## 2022-11-08 ENCOUNTER — Ambulatory Visit
Admission: EM | Admit: 2022-11-08 | Discharge: 2022-11-08 | Disposition: A | Discharge status: Home / Self Care | Payer: BC Managed Care – PPO | Attending: Emergency Medicine | Admitting: Emergency Medicine

## 2022-11-08 DIAGNOSIS — K029 Dental caries, unspecified: Secondary | ICD-10-CM | POA: Diagnosis not present

## 2022-11-08 DIAGNOSIS — Z8679 Personal history of other diseases of the circulatory system: Secondary | ICD-10-CM | POA: Diagnosis not present

## 2022-11-08 DIAGNOSIS — F17200 Nicotine dependence, unspecified, uncomplicated: Secondary | ICD-10-CM

## 2022-11-08 MED ORDER — AMOXICILLIN-POT CLAVULANATE 875-125 MG PO TABS: 1.0000 | ORAL_TABLET | Freq: Two times a day (BID) | ORAL | 0 refills | Status: DC

## 2022-11-08 NOTE — ED Provider Notes (Signed)
[facility]    CSN: [id number] Arrival date & time: 11/08/22  1510      History   Chief Complaint Chief Complaint  Patient presents with   Dental Pain    HPI Leonard Lee is a 42 y.o. male.   42 year old male, Leonard Lee, presents to urgent care for evaluation of left lower molar dental pain for several years.  Patient states the pain is keeping him from taking his blood pressure medicine.  Patient states he does not have a dentist is aware he needs to have the tooth evaluated and fixed.   The history is provided by the patient. No language interpreter was used.    Past Medical History:  Diagnosis Date   Hypertension     Patient Active Problem List   Diagnosis Date Noted   Pain due to dental caries 11/08/2022   History of hypertension 11/08/2022   Smoker 11/08/2022   Essential hypertension 05/16/2021   Morbid obesity with body mass index (BMI) of 45.0 to 49.9 in adult [facility]) 05/16/2021    Past Surgical History:  Procedure Laterality Date   EYE SURGERY  1984   FINGER SURGERY  2004       Home Medications    Prior to Admission medications   Medication Sig Start Date End Date Taking? Authorizing Provider  amLODipine (NORVASC) 10 MG tablet Take 1 tablet (10 mg total) by mouth daily. 11/03/22  Yes Mecum, Erin E, PA-C  amoxicillin-clavulanate (AUGMENTIN) 875-125 MG tablet Take 1 tablet by mouth every 12 (twelve) hours. 11/08/22  Yes Alayshia Marini, Para March, NP  ibuprofen (ADVIL) 600 MG tablet Take 1 tablet (600 mg total) by mouth every 6 (six) hours as needed. 08/04/21  Yes Becky Augusta, NP  olmesartan (BENICAR) 40 MG tablet Take 1 tablet (40 mg total) by mouth daily. 03/27/22  Yes Karamalegos, Netta Neat, DO  ciprofloxacin (CILOXAN) 0.3 % ophthalmic solution Administer 1 drop, every 2 hours, while awake, for 2 days. Then 1 drop, every 4 hours, while awake, for the next 5 days. Patient not taking: Reported on 11/03/2022 06/29/22   Eusebio Friendly B, PA-C  CONTRAVE 8-90 MG  TB12 Week 1: Take 1 tab daily with breakfast. Week 2: Take 1 tab twice daily with meal. Week 3: Take 2 tab with AM meal and 1 tab with PM meal. Week 4+: Take 2 tabs twice daily with meals - continue for weight loss Patient not taking: Reported on 11/03/2022 03/27/22   Smitty Cords, DO  ketorolac (ACULAR) 0.5 % ophthalmic solution Place 1 drop into both eyes 4 (four) times daily as needed (for pain). Patient not taking: Reported on 11/03/2022 06/29/22   Shirlee Latch, PA-C  meloxicam (MOBIC) 7.5 MG tablet Take 1 tablet (7.5 mg total) by mouth daily. Patient not taking: Reported on 11/03/2022 09/24/21   Merrilee Jansky, MD  methocarbamol (ROBAXIN) 500 MG tablet Take 1 tablet (500 mg total) by mouth at bedtime as needed for muscle spasms. Patient not taking: Reported on 11/03/2022 09/24/21   Merrilee Jansky, MD  tadalafil (CIALIS) 20 MG tablet Take 0.5-1 tablets (10-20 mg total) by mouth every other day as needed for erectile dysfunction. Patient not taking: Reported on 11/03/2022 03/27/22   Smitty Cords, DO  albuterol (VENTOLIN HFA) 108 (90 Base) MCG/ACT inhaler Inhale 2 puffs into the lungs every 4 (four) hours as needed for up to 7 days for wheezing or shortness of breath. For cough and shortness of breath 09/07/19 11/08/19  Rodriguez-Southworth,  Nettie Elm, PA-C  lisinopril-hydrochlorothiazide (ZESTORETIC) 20-12.5 MG tablet lisinopril 20 mg-hydrochlorothiazide 12.5 mg tablet  08/13/20  [provider]    Family History Family History  Problem Relation Age of Onset   Heart disease Father     Social History Social History   Tobacco Use   Smoking status: Former    Current packs/day: 0.00    Types: Cigarettes    Quit date: 03/23/2022    Years since quitting: 0.6   Smokeless tobacco: Never  Vaping Use   Vaping status: Former  Substance Use Topics   Alcohol use: Not Currently    Comment: Occ.   Drug use: Not Currently     Allergies   Patient has no known  allergies.   Review of Systems Review of Systems  Constitutional:  Negative for fever.  HENT:  Positive for dental problem. Negative for facial swelling.   All other systems reviewed and are negative.    Physical Exam Triage Vital Signs ED Triage Vitals  Encounter Vitals Group     BP      Systolic BP Percentile      Diastolic BP Percentile      Pulse      Resp      Temp      Temp src      SpO2      Weight      Height      Head Circumference      Peak Flow      Pain Score      Pain Loc      Pain Education      Exclude from Growth Chart    No data found.  Updated Vital Signs BP (!) 178/106 (BP Location: Left Arm)   Pulse 88   Temp 98.8 F (37.1 C) (Oral)   Ht 6\' 2"  (1.88 m)   Wt (!) 377 lb (171 kg)   SpO2 93%   BMI 48.40 kg/m   Visual Acuity Right Eye Distance:   Left Eye Distance:   Bilateral Distance:    Right Eye Near:   Left Eye Near:    Bilateral Near:     Physical Exam Vitals and nursing note reviewed.  Constitutional:      Appearance: He is well-developed and well-groomed. He is morbidly obese.  HENT:     Head: Normocephalic.     Jaw: No trismus.     Comments: No facial swelling    Mouth/Throat:     Lips: Pink.     Mouth: Mucous membranes are moist.     Dentition: Abnormal dentition. Dental tenderness and dental caries present. No dental abscesses or gum lesions.   Eyes:     General: Lids are normal.     Pupils: Pupils are equal, round, and reactive to light.  Cardiovascular:     Rate and Rhythm: Normal rate.  Pulmonary:     Effort: Pulmonary effort is normal.  Neurological:     General: No focal deficit present.     Mental Status: He is alert and oriented to person, place, and time.     GCS: GCS eye subscore is 4. GCS verbal subscore is 5. GCS motor subscore is 6.  Psychiatric:        Attention and Perception: Attention normal.        Mood and Affect: Mood normal.        Speech: Speech normal.        Behavior: Behavior normal.  Behavior is cooperative.  UC Treatments / Results  Labs (all labs ordered are listed, but only abnormal results are displayed) Labs Reviewed - No data to display  EKG   Radiology No results found.  Procedures Procedures (including critical care time)  Medications Ordered in UC Medications - No data to display  Initial Impression / Assessment and Plan / UC Course  I have reviewed the triage vital signs and the nursing notes.  Pertinent labs & imaging results that were available during my care of the patient were reviewed by me and considered in my medical decision making (see chart for details).     Ddx: Dental pian, dental caries, hypertension Final Clinical Impressions(s) / UC Diagnoses   Final diagnoses:  Pain due to dental caries  History of hypertension  Smoker     Discharge Instructions      Your blood pressure is elevated,please take home meds as prescribed,do not skip them.  Please follow-up with your primary care provider.  If you develop any chest pain, shortness of breath, headache, vision change, dizziness in setting of high blood pressure you need to go to the emergency room.  Please follow up with a dental provider, take antibiotic as directed, may use over the counter oragel or tylenol for pain as label directed. Go to Er for new or worsening issues(unable to take fluids,unable to keep medication down, worsening pain/swelling). Avoid chewing gum, hard candy, crunching ice as it makes issues worse.  Stop smoking.     ED Prescriptions     Medication Sig Dispense Auth. Provider   amoxicillin-clavulanate (AUGMENTIN) 875-125 MG tablet Take 1 tablet by mouth every 12 (twelve) hours. 14 tablet Zakira Ressel, Para March, NP      PDMP not reviewed this encounter.   Clancy Gourd, NP 11/08/22 [phone]

## 2022-11-08 NOTE — Discharge Instructions (Addendum)
Your blood pressure is elevated,please take home meds as prescribed,do not skip them.  Please follow-up with your primary care provider.  If you develop any chest pain, shortness of breath, headache, vision change, dizziness in setting of high blood pressure you need to go to the emergency room.  Please follow up with a dental provider, take antibiotic as directed, may use over the counter oragel or tylenol for pain as label directed. Go to Er for new or worsening issues(unable to take fluids,unable to keep medication down, worsening pain/swelling). Avoid chewing gum, hard candy, crunching ice as it makes issues worse.  Stop smoking.

## 2022-11-08 NOTE — ED Triage Notes (Signed)
Pt c/o mouth pain x1-2years  Pt states that he has a couple of broken teeth that is causing pain.  Pt does not have a dentist.  Pt states that it feels like a rusty blade is stuck in his jaw and he is worried about an infection.

## 2022-12-07 ENCOUNTER — Telehealth: Payer: BC Managed Care – PPO | Admitting: Nurse Practitioner

## 2022-12-07 ENCOUNTER — Ambulatory Visit: Payer: Self-pay

## 2022-12-07 DIAGNOSIS — J0110 Acute frontal sinusitis, unspecified: Secondary | ICD-10-CM

## 2022-12-07 DIAGNOSIS — J40 Bronchitis, not specified as acute or chronic: Secondary | ICD-10-CM | POA: Diagnosis not present

## 2022-12-07 MED ORDER — AZITHROMYCIN 250 MG PO TABS
ORAL_TABLET | ORAL | 0 refills | Status: AC
Start: 2022-12-07 — End: 2022-12-12

## 2022-12-07 MED ORDER — FLUTICASONE PROPIONATE 50 MCG/ACT NA SUSP
2.0000 | Freq: Every day | NASAL | 6 refills | Status: DC
Start: 2022-12-07 — End: 2023-11-29

## 2022-12-07 MED ORDER — ALBUTEROL SULFATE HFA 108 (90 BASE) MCG/ACT IN AERS
2.0000 | INHALATION_SPRAY | Freq: Four times a day (QID) | RESPIRATORY_TRACT | 0 refills | Status: DC | PRN
Start: 2022-12-07 — End: 2023-11-29

## 2022-12-07 NOTE — Telephone Encounter (Signed)
FYI

## 2022-12-07 NOTE — Telephone Encounter (Signed)
Summary: COVID Advice   Pt is calling to report that he thinks that my have COVID. But has not taken test. Pt reports sx have begun over a week ago with congestion, cough, and difficulty sleeping. Please advise        Chief Complaint: Cough with green mucus, sinus congestion Symptoms: Above Frequency: 1 week ago Pertinent Negatives: Patient denies fever Disposition: [] ED /[] Urgent Care (no appt availability in office) / [] Appointment(In office/virtual)/ [x]  [facility]/ [] Home Care/ [] Refused Recommended Disposition /[]  Mobile Bus/ []  Follow-up with PCP Additional Notes: Pt. Agrees with appointment. No availability with practice.  Reason for Disposition  [1] MILD difficulty breathing (e.g., minimal/no SOB at rest, SOB with walking, pulse <100) AND [2] still present when not coughing  Answer Assessment - Initial Assessment Questions 1. ONSET: "When did the cough begin?"      1 week ago 2. SEVERITY: "How bad is the cough today?"      Severe 3. SPUTUM: "Describe the color of your sputum" (none, dry cough; clear, white, yellow, green)     Thick, green 4. HEMOPTYSIS: "Are you coughing up any blood?" If so ask: "How much?" (flecks, streaks, tablespoons, etc.)     No 5. DIFFICULTY BREATHING: "Are you having difficulty breathing?" If Yes, ask: "How bad is it?" (e.g., mild, moderate, severe)    - MILD: No SOB at rest, mild SOB with walking, speaks normally in sentences, can lie down, no retractions, pulse < 100.    - MODERATE: SOB at rest, SOB with minimal exertion and prefers to sit, cannot lie down flat, speaks in phrases, mild retractions, audible wheezing, pulse 100-120.    - SEVERE: Very SOB at rest, speaks in single words, struggling to breathe, sitting hunched forward, retractions, pulse > 120      Mild 6. FEVER: "Do you have a fever?" If Yes, ask: "What is your temperature, how was it measured, and when did it start?"     No 7. CARDIAC HISTORY: "Do you have any  history of heart disease?" (e.g., heart attack, congestive heart failure)      No 8. LUNG HISTORY: "Do you have any history of lung disease?"  (e.g., pulmonary embolus, asthma, emphysema)     No 9. PE RISK FACTORS: "Do you have a history of blood clots?" (or: recent major surgery, recent prolonged travel, bedridden)     No 10. OTHER SYMPTOMS: "Do you have any other symptoms?" (e.g., runny nose, wheezing, chest pain)       Sinus 11. PREGNANCY: "Is there any chance you are pregnant?" "When was your last menstrual period?"       N/a 12. TRAVEL: "Have you traveled out of the country in the last month?" (e.g., travel history, exposures)       No  Protocols used: Cough - Acute Productive-A-AH

## 2022-12-07 NOTE — Progress Notes (Signed)
Virtual Visit Consent   Leonard Lee, you are scheduled for a virtual visit with a [facility] provider today. Just as with appointments in the office, your consent must be obtained to participate. Your consent will be active for this visit and any virtual visit you may have with one of our providers in the next 365 days. If you have a MyChart account, a copy of this consent can be sent to you electronically.  As this is a virtual visit, video technology does not allow for your provider to perform a traditional examination. This may limit your provider's ability to fully assess your condition. If your provider identifies any concerns that need to be evaluated in person or the need to arrange testing (such as labs, EKG, etc.), we will make arrangements to do so. Although advances in technology are sophisticated, we cannot ensure that it will always work on either your end or our end. If the connection with a video visit is poor, the visit may have to be switched to a telephone visit. With either a video or telephone visit, we are not always able to ensure that we have a secure connection.  By engaging in this virtual visit, you consent to the provision of healthcare and authorize for your insurance to be billed (if applicable) for the services provided during this visit. Depending on your insurance coverage, you may receive a charge related to this service.  I need to obtain your verbal consent now. Are you willing to proceed with your visit today? Leonard Lee has provided verbal consent on 12/07/2022 for a virtual visit (video or telephone). Viviano Simas, FNP  Date: 12/07/2022 8:54 AM  Virtual Visit via Video Note   I, Viviano Simas, connected with  Leonard Lee  ([id number], 1980/07/26) on 12/07/22 at  9:00 AM EDT by a video-enabled telemedicine application and verified that I am speaking with the correct person using two identifiers.  Location: Patient: Virtual Visit Location Patient: Home Provider:  Virtual Visit Location Provider: Home Office   I discussed the limitations of evaluation and management by telemedicine and the availability of in person appointments. The patient expressed understanding and agreed to proceed.    History of Present Illness: Leonard Lee is a 42 y.o. who identifies as a male who was assigned male at birth, and is being seen today for cough and congestion that started over one week ago.  He has not taken a COVID test Has been using Mucinex and Alka Seltzer cold and flu  Symptom onset was 1 week ago   Today his main concern is that the congestion at night is making it difficult to sleep.   Denies a history of asthma or COPD has used inhalers in the past for URIs in the past   Feels his cough is worse than his sinus congestion today and has productive component   History includes HTN     Problems:  Patient Active Problem List   Diagnosis Date Noted   Pain due to dental caries 11/08/2022   History of hypertension 11/08/2022   Smoker 11/08/2022   Essential hypertension 05/16/2021   Morbid obesity with body mass index (BMI) of 45.0 to 49.9 in adult [facility]) 05/16/2021    Allergies: No Known Allergies Medications:  Current Outpatient Medications:    amLODipine (NORVASC) 10 MG tablet, Take 1 tablet (10 mg total) by mouth daily., Disp: 60 tablet, Rfl: 0   amoxicillin-clavulanate (AUGMENTIN) 875-125 MG tablet, Take 1 tablet by mouth every 12 (twelve) hours.,  Disp: 14 tablet, Rfl: 0   ciprofloxacin (CILOXAN) 0.3 % ophthalmic solution, Administer 1 drop, every 2 hours, while awake, for 2 days. Then 1 drop, every 4 hours, while awake, for the next 5 days. (Patient not taking: Reported on 11/03/2022), Disp: 5 mL, Rfl: 0   CONTRAVE 8-90 MG TB12, Week 1: Take 1 tab daily with breakfast. Week 2: Take 1 tab twice daily with meal. Week 3: Take 2 tab with AM meal and 1 tab with PM meal. Week 4+: Take 2 tabs twice daily with meals - continue for weight loss (Patient not  taking: Reported on 11/03/2022), Disp: 120 tablet, Rfl: 0   ibuprofen (ADVIL) 600 MG tablet, Take 1 tablet (600 mg total) by mouth every 6 (six) hours as needed., Disp: 30 tablet, Rfl: 0   ketorolac (ACULAR) 0.5 % ophthalmic solution, Place 1 drop into both eyes 4 (four) times daily as needed (for pain). (Patient not taking: Reported on 11/03/2022), Disp: 5 mL, Rfl: 0   meloxicam (MOBIC) 7.5 MG tablet, Take 1 tablet (7.5 mg total) by mouth daily. (Patient not taking: Reported on 11/03/2022), Disp: 30 tablet, Rfl: 0   methocarbamol (ROBAXIN) 500 MG tablet, Take 1 tablet (500 mg total) by mouth at bedtime as needed for muscle spasms. (Patient not taking: Reported on 11/03/2022), Disp: 20 tablet, Rfl: 0   olmesartan (BENICAR) 40 MG tablet, Take 1 tablet (40 mg total) by mouth daily., Disp: 90 tablet, Rfl: 3   tadalafil (CIALIS) 20 MG tablet, Take 0.5-1 tablets (10-20 mg total) by mouth every other day as needed for erectile dysfunction. (Patient not taking: Reported on 11/03/2022), Disp: 30 tablet, Rfl: 5  Observations/Objective: Patient is well-developed, well-nourished in no acute distress.  Resting comfortably  at home.  Head is normocephalic, atraumatic.  No labored breathing.  Speech is clear and coherent with logical content.  Patient is alert and oriented at baseline.    Assessment and Plan:  1. Bronchitis  - albuterol (VENTOLIN HFA) 108 (90 Base) MCG/ACT inhaler; Inhale 2 puffs into the lungs every 6 (six) hours as needed for wheezing or shortness of breath.  Dispense: 8 g; Refill: 0 - azithromycin (ZITHROMAX) 250 MG tablet; Take 2 tablets on day 1, then 1 tablet daily on days 2 through 5  Dispense: 6 tablet; Refill: 0  2. Acute non-recurrent frontal sinusitis  - fluticasone (FLONASE) 50 MCG/ACT nasal spray; Place 2 sprays into both nostrils daily.  Dispense: 16 g; Refill: 6    Continue Mucinex (without decongestant) for cough support OTC   Follow Up Instructions: I discussed the  assessment and treatment plan with the patient. The patient was provided an opportunity to ask questions and all were answered. The patient agreed with the plan and demonstrated an understanding of the instructions.  A copy of instructions were sent to the patient via MyChart unless otherwise noted below.    The patient was advised to call back or seek an in-person evaluation if the symptoms worsen or if the condition fails to improve as anticipated.  Time:  I spent 14 minutes with the patient via telehealth technology discussing the above problems/concerns.    Viviano Simas, FNP

## 2022-12-15 ENCOUNTER — Ambulatory Visit (INDEPENDENT_AMBULATORY_CARE_PROVIDER_SITE_OTHER): Payer: BC Managed Care – PPO | Admitting: Family Medicine

## 2022-12-15 VITALS — BP 168/94 | HR 82 | Ht 74.0 in | Wt 390.0 lb

## 2022-12-15 DIAGNOSIS — Z6841 Body Mass Index (BMI) 40.0 and over, adult: Secondary | ICD-10-CM

## 2022-12-15 DIAGNOSIS — I10 Essential (primary) hypertension: Secondary | ICD-10-CM | POA: Diagnosis not present

## 2022-12-15 DIAGNOSIS — R29818 Other symptoms and signs involving the nervous system: Secondary | ICD-10-CM | POA: Insufficient documentation

## 2022-12-15 MED ORDER — HYDROCHLOROTHIAZIDE 25 MG PO TABS
25.0000 mg | ORAL_TABLET | Freq: Every day | ORAL | 3 refills | Status: DC
Start: 2022-12-15 — End: 2023-11-29

## 2022-12-15 MED ORDER — AMLODIPINE BESYLATE 10 MG PO TABS
10.0000 mg | ORAL_TABLET | Freq: Every day | ORAL | 3 refills | Status: DC
Start: 2022-12-15 — End: 2023-11-29

## 2022-12-15 NOTE — Progress Notes (Signed)
Subjective:    Patient ID: Leonard Lee, male    DOB: [birth year], 42 y.o.   MRN: [id number]  Leonard Lee is a 42 y.o. male presenting on 12/15/2022 for Hypertension and Sleeping Problem   HPI  DOT physical - given 3 month approval due to sleep apnea problem.  OSA - Patient reports prior history of dx OSA for years but not tested by sleep study - Last visit 03/2022, we referred him for sleep study, but it was not scheduled due to phone and follow up issues - He admits poor sleep at night and takes in larger amounts of caffeine to help - Recent URI difficulty breathing and congestion Admits excessive daytime sleepiness. Admits apnea episodes witnessed Does not have CPAP, has not been tested   Epworth Sleepiness Scale Total Score: 16 Sitting and reading - 3 Watching TV - 3 Sitting inactive in a public place - 3 As a passenger in a car for an hour without a break - 3 Lying down to rest in the afternoon when circumstances permit - 3 Sitting and talking to someone - 0 Sitting quietly after a lunch without alcohol - 1 In a car, while stopped for a few minutes in traffic - 0   STOP-Bang OSA scoring Snoring yes    Tiredness yes    Observed apneas yes    Pressure HTN yes    BMI > 35 kg/m2 yes    Age > 50  no    Neck (male >17 in; Male >16 in)  yes >17"  Gender male yes    OSA risk low (0-2)  OSA risk intermediate (3-4)  OSA risk high (5+)   Total: 7 high risk      -----------------------------      CHRONIC HTN: Obesity BMI >50 Severe elevated BP,  >200/90-100 at times. OSA uncontrolled still Previously on hydrochlorothiazide 12.5mg  but too frequent urination. Now he has edema and wants to restart the thiazide diuretic Current Meds - Amlodipine 10mg  daily, Olmesartan 40mg  daily He has ACEi Cough reaction on Lisinopril.   Reports good compliance, when has meds but did not take today Lifestyle: - Diet: eating whole foods, more natural, less junk, more fiber, reduced or stopped  alcohol intake - Exercise: increasing exercise regimen. Admits edema Denies CP, dyspnea, HA, dizziness / lightheadedness       12/15/2022    9:00 AM 05/16/2021    9:59 AM  Depression screen PHQ 2/9  Decreased Interest 1 2  Down, Depressed, Hopeless 0 1  PHQ - 2 Score 1 3  Altered sleeping 3 1  Tired, decreased energy  1  Change in appetite 3 0  Feeling bad or failure about yourself  0 0  Trouble concentrating 3 1  Moving slowly or fidgety/restless 0 0  Suicidal thoughts 0 0  PHQ-9 Score 10 6  Difficult doing work/chores Extremely dIfficult Very difficult    Social History   Tobacco Use   Smoking status: Former    Current packs/day: 0.00    Types: Cigarettes    Quit date: 03/23/2022    Years since quitting: 0.7   Smokeless tobacco: Never  Vaping Use   Vaping status: Former  Substance Use Topics   Alcohol use: Not Currently    Comment: Occ.   Drug use: Not Currently    Review of Systems Per HPI unless specifically indicated above     Objective:    BP (!) 168/94 (BP Location: Left Arm, Cuff Size: Normal)  Pulse 82   Ht 6\' 2"  (1.88 m)   Wt (!) 390 lb (176.9 kg)   SpO2 97%   BMI 50.07 kg/m   Wt Readings from Last 3 Encounters:  12/15/22 (!) 390 lb (176.9 kg)  11/08/22 (!) 377 lb (171 kg)  11/03/22 (!) 372 lb (168.7 kg)    Physical Exam Vitals and nursing note reviewed.  Constitutional:      General: He is not in acute distress.    Appearance: He is well-developed. He is obese. He is not diaphoretic.     Comments: Well-appearing, comfortable, cooperative  HENT:     Head: Normocephalic and atraumatic.  Eyes:     General:        Right eye: No discharge.        Left eye: No discharge.     Conjunctiva/sclera: Conjunctivae normal.  Neck:     Thyroid: No thyromegaly.  Cardiovascular:     Rate and Rhythm: Normal rate and regular rhythm.     Pulses: Normal pulses.     Heart sounds: Normal heart sounds. No murmur heard. Pulmonary:     Effort: Pulmonary  effort is normal. No respiratory distress.     Breath sounds: Normal breath sounds. No wheezing or rales.  Musculoskeletal:        General: Normal range of motion.     Cervical back: Normal range of motion and neck supple.     Right lower leg: Edema present.     Left lower leg: Edema present.  Lymphadenopathy:     Cervical: No cervical adenopathy.  Skin:    General: Skin is warm and dry.     Findings: No erythema or rash.  Neurological:     Mental Status: He is alert and oriented to person, place, and time. Mental status is at baseline.  Psychiatric:        Behavior: Behavior normal.     Comments: Well groomed, good eye contact, normal speech and thoughts       Results for orders placed or performed in visit on 06/13/21  BASIC METABOLIC PANEL WITH GFR  Result Value Ref Range   Glucose, Bld 95 65 - 139 mg/dL   BUN 17 7 - 25 mg/dL   Creat 9.14 7.82 - 9.56 mg/dL   eGFR 95 > OR = 60 OZ/HYQ/6.57Q4   BUN/Creatinine Ratio NOT APPLICABLE 6 - 22 (calc)   Sodium 138 135 - 146 mmol/L   Potassium 4.0 3.5 - 5.3 mmol/L   Chloride 106 98 - 110 mmol/L   CO2 23 20 - 32 mmol/L   Calcium 9.1 8.6 - 10.3 mg/dL  Testosterone  Result Value Ref Range   Testosterone 542 250 - 827 ng/dL      Assessment & Plan:   Problem List Items Addressed This Visit     Essential hypertension   Relevant Medications   amLODipine (NORVASC) 10 MG tablet   hydrochlorothiazide (HYDRODIURIL) 25 MG tablet   Morbid obesity with BMI of 50.0-59.9, adult (HCC) - Primary   Suspected sleep apnea    Assessment and Plan    Suspected Obstructive Sleep Apnea Patient reports excessive daytime sleepiness and inability to maintain alertness during activities such as reading. Previous referral for sleep study was not completed due to communication issues.  reported symptoms with witnessed apnea, snoring and sleep disturbance, fatigue excessive sleepiness. - Screening: ESS score 16 / STOP-Bang Score 7 high risk - Neck  Circumference: >17" - Co-morbidities: HYPERTENSION, Morbid Obesity  -Resend referral  to [facility] for sleep study. Fax ready to be sent 9/25 -Provide patient with contact information for sleep center.   Hypertension Blood pressure elevated at 208/96 during visit. Patient reports inconsistent control, with some readings as high as 180/167. Patient has been doubling dose of amlodipine to 20mg  without significant improvement. Patient also reports high caffeine intake. -Resume amlodipine at 10mg  daily, advised to avoid 20mg  dose, not appropriate -RESTART / Add hydrochlorothiazide 25mg  daily. -Continue olmesartan 40mg  daily. -Order 90-day supply of amlodipine and hydrochlorothiazide. -Consider future adjustment of medications to include agents such as Beta blocker in future, if we can scale back on meds if wt improves, OSA improved  Morbid Obesity BMI >50 / Weight Management Patient expresses interest in weight loss medications but has concerns about insurance coverage and cost. Patient did not start previously prescribed Contrave due to these concerns. -Provide 7-day sample of Contrave for patient to try. -Advise patient to check with insurance about coverage for weight management medications. -Provide information on out-of-pocket options for weight loss medications.  Follow-up Schedule follow-up appointment in approximately 3 months, with flexibility to adjust based on completion of sleep study.       Meds ordered this encounter  Medications   amLODipine (NORVASC) 10 MG tablet    Sig: Take 1 tablet (10 mg total) by mouth daily.    Dispense:  90 tablet    Refill:  3   hydrochlorothiazide (HYDRODIURIL) 25 MG tablet    Sig: Take 1 tablet (25 mg total) by mouth daily.    Dispense:  90 tablet    Refill:  3      Follow up plan: Return in about 3 months (around 03/16/2023) for 3 month HTN Weight follow-up.  Saralyn Pilar, DO [facility] 12/15/2022, 8:40 AM

## 2022-12-15 NOTE — Patient Instructions (Addendum)
Thank you for coming to the office today.  [facility] Address: [address] [place], [place], [place] [address] Phone: [phone]  Referral sent via fax today. Stay tuned to hear back from them, they should call you to schedule initial sleep study, likely a home test and then if abnormal - they will arrange the 2nd part of the sleep study in the lab with the CPAP machine.  If there is any issue with this company, for example not covered by insurance or other problem, please notify us and they may do so a well - we will need to change the location of the referral.  ------------------------------------------------------------------------  NEW BP med Hydrochlorothiazide 25mg  daily fluid pill  Keep on current meds Olmesartan 40 and Amlodipine 10mg  daily  ------------  For Weight Loss / Obesity only  Check with insurance on coverage for ANY WEIGHT LOSS MEDICATION  Zepbound, Reginal Lutes are top of the line Saxenda is daily injection it is possible too  ----------------------------  Contrave - oral medication, appetite suppression has wellbutrin/bupropion and naltrexone in it and it can also help with appetite, it is ordered through a speciality pharmacy. - $99 per month  Free sample 7 day, 1 pill per day for 1 week  Alternative options  Semaglutide injection (mixed Ozempic) from [facility] 0.25mg  weekly for 4 weeks then increase to 0.5mg  weekly It comes in a vial and a needle syringe, you need to draw up the shot and self admin it weekly Cost is about $200 per month Call them to check pricing and availability  [facility] Address: [address] [place], [place] [address] Phone: [phone]  --------------------------------  [facility] [place] does not accept insurance. Call to schedule / Walk in to schedule They can do the weekly weight loss injections (same as Ozempic) Pay per visit and per shot. It may be approximately  $60-75+ per visit/shot, approximately $200-300 range per month depending  [facility] Address: [address] [place], [place] [address] Phone: [phone]    Please schedule a Follow-up Appointment to: Return in about 3 months (around 03/16/2023) for 3 month HTN Weight follow-up.  If you have any other questions or concerns, please feel free to call the office or send a message through MyChart. You may also schedule an earlier appointment if necessary.  Additionally, you may be receiving a survey about your experience at our office within a few days to 1 week by e-mail or mail. We value your feedback.  Leonard Pilar, DO [facility], [place]

## 2022-12-22 ENCOUNTER — Ambulatory Visit: Payer: BC Managed Care – PPO | Admitting: Family Medicine

## 2023-01-06 ENCOUNTER — Ambulatory Visit: Payer: BC Managed Care – PPO | Admitting: Family Medicine

## 2023-08-11 ENCOUNTER — Ambulatory Visit: Payer: Self-pay | Admitting: Family Medicine

## 2023-08-11 ENCOUNTER — Encounter: Payer: Self-pay | Admitting: Family Medicine

## 2023-08-11 ENCOUNTER — Ambulatory Visit: Payer: Self-pay

## 2023-08-11 VITALS — BP 150/88 | HR 70 | Ht 74.0 in | Wt 331.0 lb

## 2023-08-11 DIAGNOSIS — M5442 Lumbago with sciatica, left side: Secondary | ICD-10-CM

## 2023-08-11 DIAGNOSIS — G5702 Lesion of sciatic nerve, left lower limb: Secondary | ICD-10-CM

## 2023-08-11 DIAGNOSIS — D171 Benign lipomatous neoplasm of skin and subcutaneous tissue of trunk: Secondary | ICD-10-CM

## 2023-08-11 MED ORDER — GABAPENTIN 100 MG PO CAPS: ORAL_CAPSULE | ORAL | 1 refills | Status: DC

## 2023-08-11 MED ORDER — BACLOFEN 10 MG PO TABS
5.0000 mg | ORAL_TABLET | Freq: Three times a day (TID) | ORAL | 1 refills | Status: DC | PRN
Start: 2023-08-11 — End: 2023-11-29

## 2023-08-11 MED ORDER — NAPROXEN 500 MG PO TABS
500.0000 mg | ORAL_TABLET | Freq: Two times a day (BID) | ORAL | 2 refills | Status: DC
Start: 2023-08-11 — End: 2023-11-29

## 2023-08-11 NOTE — Progress Notes (Signed)
 Subjective:    Patient ID: Leonard Lee, male    DOB: 22-Feb-1981, 43 y.o.   MRN: [id number]  Leonard Lee is a 43 y.o. male presenting on 08/11/2023 for Sciatica and Back Pain  Patient presents for a same day appointment.  HPI  Discussed the use of AI scribe software for clinical note transcription with the patient, who gave verbal consent to proceed.  History of Present Illness   Leonard Lee is a 43 year old male with a history of sciatic pain and lipomas who presents with left leg pain and a new gluteal mass.  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 He missed work today due to the severity of the pain. The pain is described as a spasm or 'a really bad charley horse' and has worsened over time. It initially started in the lower back and buttocks before radiating down the leg, consistent with his previous experiences of sciatic pain.  He has noticed a new mass in the gluteal region, described as a 'knot' or 'golf ball' sized lump. The mass is sore to touch and has not always been present. There is no redness or surface changes associated with the mass. He has had cyst in the past, which his wife has helped remove at home. This spot is non tender. No redness or rash.  Sitting and certain movements exacerbate the pain, particularly in the lower spine region. He experiences sharp pain when tucking in his legs and tightness when lifting his leg. He has a history of using ibuprofen  for pain relief, taking 2000 mg at one time, which provided minimal relief. He has previously used cyclobenzaprine  as a muscle relaxant, which was effective but caused drowsiness. He has also taken prednisone in the past for an upper respiratory infection but experienced increased appetite and fluid  retention. No fever or signs of infection noted.     Missed work today       08/11/2023   10:54 AM 12/15/2022    9:00 AM 05/16/2021    9:59 AM  Depression screen PHQ 2/9  Decreased Interest 0 1 2  Down, Depressed, Hopeless 0 0 1  PHQ - 2 Score 0 1 3  Altered sleeping 0 3 1  Tired, decreased energy 0  1  Change in appetite 0 3 0  Feeling bad or failure about yourself  0 0 0  Trouble concentrating 1 3 1   Moving slowly or fidgety/restless 0 0 0  Suicidal thoughts 0 0 0  PHQ-9 Score 1 10 6   Difficult doing work/chores Not difficult at all Extremely dIfficult Very difficult       08/11/2023   10:54 AM 12/15/2022    9:01 AM 05/16/2021   10:00 AM  GAD 7 : Generalized Anxiety Score  Nervous, Anxious, on Edge 0 1 1  Control/stop worrying 0 1 1  Worry too much - different things 0 1 1  Trouble relaxing 0 1 2  Restless 0 1 0  Easily annoyed or irritable 1 2 2   Afraid - awful might happen 0 1 2  Total GAD 7 Score 1 8 9   Anxiety Difficulty Not difficult at all  Very difficult    Social History   Tobacco Use   Smoking status: Former    Current packs/day: 0.00  Types: Cigarettes    Quit date: 03/23/2022    Years since quitting: 1.3   Smokeless tobacco: Never  Vaping Use   Vaping status: Former  Substance Use Topics   Alcohol use: Not Currently    Comment: Occ.   Drug use: Not Currently    Review of Systems Per HPI unless specifically indicated above     Objective:     BP (!) 150/88 (BP Location: Left Arm, Cuff Size: Large)   Pulse 70   Ht 6\' 2"  (1.88 m)   Wt (!) 331 lb (150.1 kg)   SpO2 99%   BMI 42.50 kg/m   Wt Readings from Last 3 Encounters:  08/11/23 (!) 331 lb (150.1 kg)  12/15/22 (!) 390 lb (176.9 kg)  11/08/22 (!) 377 lb (171 kg)    Physical Exam Vitals and nursing note reviewed.  Constitutional:      General: He is not in acute distress.    Appearance: He is well-developed. He is obese. He is not diaphoretic.     Comments: Well-appearing,  comfortable, cooperative  HENT:     Head: Normocephalic and atraumatic.  Eyes:     General:        Right eye: No discharge.        Left eye: No discharge.     Conjunctiva/sclera: Conjunctivae normal.  Neck:     Thyroid: No thyromegaly.  Cardiovascular:     Rate and Rhythm: Normal rate and regular rhythm.     Pulses: Normal pulses.     Heart sounds: Normal heart sounds. No murmur heard. Pulmonary:     Effort: Pulmonary effort is normal. No respiratory distress.     Breath sounds: Normal breath sounds. No wheezing or rales.  Musculoskeletal:        General: Normal range of motion.     Cervical back: Normal range of motion and neck supple.     Comments: Mild paraspinal lumbar muscle hypertonicity L>R, non tender Provoked L sided pain with seated straight leg raise without radicular pain  Lymphadenopathy:     Cervical: No cervical adenopathy.  Skin:    General: Skin is warm and dry.     Findings: Lesion (2-3 cm superficial mobile well defined mass, L gluteal middle area, likely lipoma vs cyst, no erythema or skin findings) present. No erythema or rash.  Neurological:     Mental Status: He is alert and oriented to person, place, and time. Mental status is at baseline.  Psychiatric:        Behavior: Behavior normal.     Comments: Well groomed, good eye contact, normal speech and thoughts     Results for orders placed or performed in visit on 06/13/21  BASIC METABOLIC PANEL WITH GFR   Collection Time: 06/13/21  8:42 AM  Result Value Ref Range   Glucose, Bld 95 65 - 139 mg/dL   BUN 17 7 - 25 mg/dL   Creat 0.45 4.09 - 8.11 mg/dL   eGFR 95 > OR = 60 BJ/YNW/2.95A2   BUN/Creatinine Ratio NOT APPLICABLE 6 - 22 (calc)   Sodium 138 135 - 146 mmol/L   Potassium 4.0 3.5 - 5.3 mmol/L   Chloride 106 98 - 110 mmol/L   CO2 23 20 - 32 mmol/L   Calcium 9.1 8.6 - 10.3 mg/dL  Testosterone    Collection Time: 06/13/21  8:42 AM  Result Value Ref Range   Testosterone  542 250 - 827 ng/dL       Assessment & Plan:  Problem List Items Addressed This Visit   None Visit Diagnoses       Acute left-sided low back pain with left-sided sciatica    -  Primary   Relevant Medications   naproxen (NAPROSYN) 500 MG tablet   baclofen (LIORESAL) 10 MG tablet   gabapentin (NEURONTIN) 100 MG capsule     Piriformis syndrome of left side       Relevant Medications   naproxen (NAPROSYN) 500 MG tablet   baclofen (LIORESAL) 10 MG tablet   gabapentin (NEURONTIN) 100 MG capsule     Lipoma of buttock            Sciatica L sided, Low back / Piriformis Syndrome Chronic left leg sciatic pain, likely due to nerve compression by the piriformis muscle. Reassured benign nature of gluteal lump, possibly a lipoma or cyst. Discussed medication options and potential side effects.  Elevated BP due to pain.  - Prescribe naproxen 500 mg twice daily. Will avoid oral prednisone steroid at this time due to weight side effects - Prescribe baclofen 10 mg, take half or whole three times a day as needed. (Note he had sedation on Flexeril , will avoid that) - Prescribe gabapentin, start with 100 mg at bedtime, increase as tolerated up to three times a day. - Provide work note for absence on Wednesday and Thursday, return on Friday or Monday  Lipoma Superficial gluteal lump, likely benign lipoma or cyst, not source of nerve pain. - Monitor the lump for changes in size or symptoms. - Consider surgical referral if the lump persists and causes discomfort.        No orders of the defined types were placed in this encounter.   Meds ordered this encounter  Medications   naproxen (NAPROSYN) 500 MG tablet    Sig: Take 1 tablet (500 mg total) by mouth 2 (two) times daily with a meal. For 1-2 weeks then as needed    Dispense:  60 tablet    Refill:  2   baclofen (LIORESAL) 10 MG tablet    Sig: Take 0.5-1 tablets (5-10 mg total) by mouth 3 (three) times daily as needed for muscle spasms.    Dispense:  30 each     Refill:  1   gabapentin (NEURONTIN) 100 MG capsule    Sig: Start 1 capsule daily, increase by 1 cap every 2-3 days as tolerated up to 3 times a day, or may take 3 at once in evening.    Dispense:  90 capsule    Refill:  1    Follow up plan: Return if symptoms worsen or fail to improve.  Domingo Friend, DO [facility] 08/11/2023, 11:15 AM

## 2023-08-11 NOTE — Telephone Encounter (Signed)
 Copied from CRM [id number]. Topic: Clinical - Red Word Triage >> Aug 11, 2023  8:16 AM Sasha H wrote: [facility] that prompted transfer to Nurse Triage: Pt is having back and leg pain and noticed a lump in his butt region, pain level 10  Call dropped when agent to transfer. Called back, call cannot be completed as dialed.

## 2023-08-11 NOTE — Telephone Encounter (Signed)
  Chief Complaint: leg pain Symptoms: L leg pain from thigh down and glut area, knot near L glut as well, pain 10/10 Frequency: ongoing a while  Pertinent Negatives: Patient denies swelling Disposition: [] ED /[] Urgent Care (no appt availability in office) / [x] Appointment(In office/virtual)/ []  [facility]/ [] Home Care/ [] Refused Recommended Disposition /[] [place] [facility]/ []  Follow-up with PCP Additional Notes: pt states he has had L sciatica pain from back to leg and now getting worse and has felt a knot near his L glut area. Pain increasing to 10/10 at times. Pt denies any physical or sports injuries that he can think of. No appt with PCP, scheduled with Leward Record, NP today at 1020.   Copied from CRM [id number]. Topic: Clinical - Red Word Triage >> Aug 11, 2023  8:16 AM Sasha H wrote: [facility] that prompted transfer to Nurse Triage: Pt is having back and leg pain and noticed a lump in his butt region, pain level 10   Reason for Disposition  [1] Thigh or calf pain AND [2] only 1 side AND [3] present > 1 hour (Exception: Chronic unchanged pain.)  Answer Assessment - Initial Assessment Questions 1. ONSET: "When did the pain start?"      Ongoing a while  2. LOCATION: "Where is the pain located?"      L leg and glut area  3. PAIN: "How bad is the pain?"    (Scale 1-10; or mild, moderate, severe)   -  MILD (1-3): doesn't interfere with normal activities    -  MODERATE (4-7): interferes with normal activities (e.g., work or school) or awakens from sleep, limping    -  SEVERE (8-10): excruciating pain, unable to do any normal activities, unable to walk     10/10 6. OTHER SYMPTOMS: "Do you have any other symptoms?" (e.g., chest pain, back pain, breathing difficulty, swelling, rash, fever, numbness, weakness)     Lump in butt region  Protocols used: Leg Pain-A-AH

## 2023-08-11 NOTE — Patient Instructions (Addendum)
 Thank you for coming to the office today.  For your Back/Hip Pain - this is most likely due to Muscle Spasms in your Piriformis Muscle (deeper in by hip), causing Sciatica (irritation of your sciatic nerve can cause shooting pain down your legs, sometimes with numbness and tingling).  Recommend trial of Anti-inflammatory with Naproxen (Naprosyn) 500mg  tabs - take one with food and plenty of water TWICE daily every day (breakfast and dinner), for next 2 to 4 weeks, then you may take only as needed  -Start Baclofen (Lioresal) 10mg  tablets - cut in half for 5mg  at night for muscle relaxant - may make you sedated or sleepy (be careful driving or working on this) if tolerated you can take every 8 hours, half or whole tab  - Also safe to add on Tylenol  Extra Strength 500mg  tabs - take 1 to 2 tabs (max 1000mg  per dose) every 6 hours for pain (take regularly, don't skip a dose for next 3-7 days), max 24 hour daily dose is 6 to 8 tablets or 3000 to 4000mg   Start Gabapentin 100mg  capsules, take at night for 2-3 nights only, and then increase to 2 times a day for a few days, and then may increase to 3 times a day, it may make you drowsy, if helps significantly at night only, then you can increase instead to 3 capsules at night, instead of 3 times a day - In the future if needed, we can significantly increase the dose if tolerated well, some common doses are 300mg  three times a day up to 600mg  three times a day, usually it takes several weeks or months to get to higher doses   Recommend to start using heating pad on your lower back 1-2x daily for few weeks  Be careful with prolonged sitting (especially if you sit on a wallet or other object this can cause flare of pain), if driving long car trip make sure to stop and take a good break to stretch legs, may find a cushion that works to ease stress of your buttocks and hip muscles.  Also try a Wedge Seat Cushion to avoid nerve pinching when sitting prolonged  period of time.  This pain may take weeks to months to fully resolve, but hopefully it will respond to the medicine initially. All back injuries (small or serious) are slow to heal since we use our back muscles every day. Be careful with turning, twisting, lifting, sitting / standing for prolonged periods, and avoid re-injury.  If your symptoms significantly worsen with more pain, or new symptoms with weakness in one or both legs, new or different shooting leg pains, numbness in legs or groin, loss of control or retention of urine or bowel movements, please call back for advice and you may need to go directly to the Emergency Department.   Please schedule a Follow-up Appointment to: Return if symptoms worsen or fail to improve.  If you have any other questions or concerns, please feel free to call the office or send a message through MyChart. You may also schedule an earlier appointment if necessary.  Additionally, you may be receiving a survey about your experience at our office within a few days to 1 week by e-mail or mail. We value your feedback.  Domingo Friend, DO [facility], [place]

## 2023-11-29 ENCOUNTER — Ambulatory Visit
Admission: EM | Admit: 2023-11-29 | Discharge: 2023-11-29 | Disposition: A | Discharge status: Home / Self Care | Attending: Emergency Medicine | Admitting: Emergency Medicine

## 2023-11-29 DIAGNOSIS — I10 Essential (primary) hypertension: Secondary | ICD-10-CM | POA: Diagnosis not present

## 2023-11-29 DIAGNOSIS — Z760 Encounter for issue of repeat prescription: Secondary | ICD-10-CM | POA: Diagnosis not present

## 2023-11-29 DIAGNOSIS — B0233 Zoster keratitis: Secondary | ICD-10-CM

## 2023-11-29 MED ORDER — HYDROCHLOROTHIAZIDE 25 MG PO TABS: 25.0000 mg | ORAL_TABLET | Freq: Every day | ORAL | 1 refills | Status: DC

## 2023-11-29 MED ORDER — IBUPROFEN 600 MG PO TABS: 600.0000 mg | ORAL_TABLET | Freq: Four times a day (QID) | ORAL | 0 refills | Status: AC | PRN

## 2023-11-29 MED ORDER — OLMESARTAN MEDOXOMIL 40 MG PO TABS: 40.0000 mg | ORAL_TABLET | Freq: Every day | ORAL | 1 refills | Status: DC

## 2023-11-29 MED ORDER — VALACYCLOVIR HCL 1 G PO TABS: 1000.0000 mg | ORAL_TABLET | Freq: Three times a day (TID) | ORAL | 0 refills | Status: AC

## 2023-11-29 MED ORDER — AMLODIPINE BESYLATE 10 MG PO TABS: 10.0000 mg | ORAL_TABLET | Freq: Every day | ORAL | 1 refills | Status: AC

## 2023-11-29 NOTE — ED Triage Notes (Signed)
 Patient states that he noticed a rash on his eye that has spread around his eye. Sx x 1 week.

## 2023-11-29 NOTE — Discharge Instructions (Signed)
 I talked to Dr. Enola, the eye doctor on-call.  He wants to see you tomorrow.  Call his office tomorrow at 8 AM and let them know that you were seen in urgent care, and the provider there spoke with Dr. Enola, and he wants to see you, so they are to work you in at some point tomorrow.  He will see you in the [facility] in [place].  In the meantime, refrigerated Systane, cool compresses as needed for symptomatic relief, 1000 mg of Tylenol  combined with 600 mg of ibuprofen  3-4 times a day as needed for pain.  Decrease your salt intake. diet and exercise will lower your blood pressure significantly. It is important to keep your blood pressure under good control, as having a elevated blood pressure for prolonged periods of time significantly increases your risk of stroke, heart attacks, kidney damage, eye damage, and other problems. Get a validated blood pressure cuff that goes on your arm, not your wrist.  Measure your blood pressure once a day, preferably at the same time every day. Keep a log of this and bring it to your next doctor's appointment.  Bring your blood pressure cuff as well.  Follow-up with your PCP in 1 to 2 weeks.  Return immediately to the ER if you start having chest pain, headache, problems seeing, problems talking, problems walking, if you feel like you're about to pass out, if you do pass out, if you have a seizure, or for any other concerns.  Go to www.goodrx.com  or www.costplusdrugs.com to look up your medications. This will give you a list of where you can find your prescriptions at the most affordable prices. Or ask the pharmacist what the cash price is, or if they have any other discount programs available to help make your medication more affordable. This can be less expensive than what you would pay with insurance.

## 2023-11-29 NOTE — ED Provider Notes (Signed)
 HPI  SUBJECTIVE:  Leonard Lee is a 43 y.o. male who presents with several days of right facial pain, swelling followed by a rash that is now scabbing.  He reports right eye redness and headache today.  He reports burning, dry eye pain, increased tearing and blurry vision.  No ear pain, photophobia, pain with EOMs, foreign body sensation or nausea, vomiting, eye pain worse in the dark.  No purulent drainage, foreign body sensation.  He is not sure if he has been exposed to any chemicals or metallic foreign bodies as he works with both at work.  He wears contacts in his left eye only.  He tried exercise, saline solution and Clear Eyes without improvement in his symptoms.  Symptoms worse with sleeping.  He has a past medical history of hypertension, but discontinued his amlodipine , olmesartan  hydrochlorothiazide  several days ago.  He has not measured his blood pressure since discontinuing these medications.  He also has a history of varicella.  History of shingles.  PCP: Nichole Arlyss Thresa Bernardino.  Ophthalmology: None.    Past Medical History:  Diagnosis Date   Hypertension     Past Surgical History:  Procedure Laterality Date   EYE SURGERY  1984   FINGER SURGERY  2004    Family History  Problem Relation Age of Onset   Heart disease Father     Social History   Tobacco Use   Smoking status: Former    Current packs/day: 0.00    Types: Cigarettes    Quit date: 03/23/2022    Years since quitting: 1.6   Smokeless tobacco: Never  Vaping Use   Vaping status: Former  Substance Use Topics   Alcohol use: Not Currently    Comment: Occ.   Drug use: Not Currently    No current facility-administered medications for this encounter.  Current Outpatient Medications:    ibuprofen  (ADVIL ) 600 MG tablet, Take 1 tablet (600 mg total) by mouth every 6 (six) hours as needed., Disp: 20 tablet, Rfl: 0   valACYclovir  (VALTREX ) 1000 MG tablet, Take 1 tablet (1,000 mg total) by mouth 3 (three) times  daily for 7 days., Disp: 21 tablet, Rfl: 0   amLODipine  (NORVASC ) 10 MG tablet, Take 1 tablet (10 mg total) by mouth daily., Disp: 30 tablet, Rfl: 1   gabapentin  (NEURONTIN ) 100 MG capsule, Start 1 capsule daily, increase by 1 cap every 2-3 days as tolerated up to 3 times a day, or may take 3 at once in evening., Disp: 90 capsule, Rfl: 1   hydrochlorothiazide  (HYDRODIURIL ) 25 MG tablet, Take 1 tablet (25 mg total) by mouth daily., Disp: 30 tablet, Rfl: 1   olmesartan  (BENICAR ) 40 MG tablet, Take 1 tablet (40 mg total) by mouth daily., Disp: 30 tablet, Rfl: 1   tadalafil  (CIALIS ) 20 MG tablet, Take 0.5-1 tablets (10-20 mg total) by mouth every other day as needed for erectile dysfunction. (Patient not taking: Reported on 08/11/2023), Disp: 30 tablet, Rfl: 5  No Known Allergies   ROS  As noted in HPI.   Physical Exam  BP (!) 216/141 (BP Location: Right Wrist)   Pulse 76   Temp 98.4 F (36.9 C) (Oral)   Resp 19   SpO2 99%  BP Readings from Last 3 Encounters:  11/29/23 (!) 216/141  08/11/23 (!) 150/88  12/15/22 (!) 168/94    Constitutional: Well developed, well nourished, no acute distress Eyes: PERRL, EOMI, intense right sided conjunctival injection with direct photophobia.  No consensual photophobia.  No  foreign body seen on lid eversion.  White lesion seen in the 3 o'clock position, 2 small peripheral white lesions in the 7 and 8 o'clock position.  No dendrites.  No corneal foreign body, negative Seidel.  No hyphema.  No foreign body seen on lid eversion.  No pain with EOMs.           Visual Acuity  Right Eye Distance: 20/30 Left Eye Distance: 20/40 Bilateral Distance: 20/30  Right Eye Near:   Left Eye Near:    Bilateral Near:     HENT: Normocephalic, atraumatic,mucus membranes moist.  EAC, TM normal.  No rash on the nose. Respiratory: Clear to auscultation bilaterally, no rales, no wheezing, no rhonchi Cardiovascular: Normal rate and rhythm, no murmurs, no gallops, no  rubs GI: nondistended,  skin: Tender vesicular rash around right eye in the V1 dermatome.  Tenderness along the right scalp.  Musculoskeletal: No lower extremity edema, no tenderness Neurologic: Alert & oriented x 3, CN III-XII grossly intact, no motor deficits, sensation grossly intact Psychiatric: Speech and behavior appropriate   ED Course   Medications - No data to display  Orders Placed This Encounter  Procedures   Recheck vitals    Repeat BP    Standing Status:   Standing    Number of Occurrences:   1   No results found for this or any previous visit (from the past 24 hours). No results found.  ED Clinical Impression  1. Herpes zoster keratoconjunctivitis   2. Elevated blood pressure reading with diagnosis of hypertension   3. Medication refill   4. Essential hypertension      ED Assessment/Plan     1.  Facial rash, right conjunctival injection.  Concern for shingles with corneal involvement.  Discussed with Dr. Enola, ophthalmology on-call.  Will start on Valtrex  1000 mg p.o. 3 times daily for 1 week. No steriod eyedrops.  He is to use refrigerated Systane, cool compresses as needed for symptomatic relief, Tylenol  combined with ibuprofen  3-4 times a day as needed prn,.  Dr. Enola will see him tomorrow in the [facility] office [facility].  Patient is to call tomorrow at 8 AM and they will work him in.    2.  Elevated blood pressure reading with diagnosis of hypertension.  Patient has not taken his medications in several days.  He is otherwise asymptomatic. Pt has no historical evidence of end organ damage. Pt denies any CNS type sx such as HA, visual changes, focal paresis, or new onset seizure activity. Pt denies any CV sx such as CP, dyspnea, palpitations, pedal edema, tearing pain radiating to back or abd. Pt denied any renal sx such as anuria or hematuria. Discussed importance of lifestyle modifications as important first steps, and the importance of  taking usual BP medications.  Will refill his medications.  Pt to f/u as OP.    Discussed  MDM, treatment plan, and plan for follow-up with patient Discussed sn/sx that should prompt return to the ED. patient agrees with plan.   Meds ordered this encounter  Medications   amLODipine  (NORVASC ) 10 MG tablet    Sig: Take 1 tablet (10 mg total) by mouth daily.    Dispense:  30 tablet    Refill:  1   hydrochlorothiazide  (HYDRODIURIL ) 25 MG tablet    Sig: Take 1 tablet (25 mg total) by mouth daily.    Dispense:  30 tablet    Refill:  1   olmesartan  (BENICAR ) 40 MG tablet  Sig: Take 1 tablet (40 mg total) by mouth daily.    Dispense:  30 tablet    Refill:  1   valACYclovir  (VALTREX ) 1000 MG tablet    Sig: Take 1 tablet (1,000 mg total) by mouth 3 (three) times daily for 7 days.    Dispense:  21 tablet    Refill:  0   ibuprofen  (ADVIL ) 600 MG tablet    Sig: Take 1 tablet (600 mg total) by mouth every 6 (six) hours as needed.    Dispense:  20 tablet    Refill:  0      *This clinic note was created using Scientist, clinical (histocompatibility and immunogenetics). Therefore, there may be occasional mistakes despite careful proofreading. ?    Van Knee, MD 11/30/23 [phone]

## 2023-11-30 DIAGNOSIS — H16002 Unspecified corneal ulcer, left eye: Secondary | ICD-10-CM | POA: Diagnosis not present

## 2023-11-30 DIAGNOSIS — B0233 Zoster keratitis: Secondary | ICD-10-CM | POA: Diagnosis not present

## 2023-11-30 DIAGNOSIS — H16421 Pannus (corneal), right eye: Secondary | ICD-10-CM | POA: Diagnosis not present

## 2023-11-30 DIAGNOSIS — H269 Unspecified cataract: Secondary | ICD-10-CM | POA: Diagnosis not present

## 2023-12-01 DIAGNOSIS — H16002 Unspecified corneal ulcer, left eye: Secondary | ICD-10-CM | POA: Diagnosis not present

## 2023-12-01 DIAGNOSIS — H269 Unspecified cataract: Secondary | ICD-10-CM | POA: Diagnosis not present

## 2023-12-01 DIAGNOSIS — B0233 Zoster keratitis: Secondary | ICD-10-CM | POA: Diagnosis not present

## 2023-12-01 DIAGNOSIS — H16421 Pannus (corneal), right eye: Secondary | ICD-10-CM | POA: Diagnosis not present

## 2023-12-02 ENCOUNTER — Ambulatory Visit: Payer: Self-pay

## 2023-12-02 ENCOUNTER — Ambulatory Visit (INDEPENDENT_AMBULATORY_CARE_PROVIDER_SITE_OTHER): Admitting: Family Medicine

## 2023-12-02 ENCOUNTER — Encounter: Payer: Self-pay | Admitting: Family Medicine

## 2023-12-02 VITALS — BP 144/86 | HR 77 | Ht 74.0 in | Wt 330.0 lb

## 2023-12-02 DIAGNOSIS — B0229 Other postherpetic nervous system involvement: Secondary | ICD-10-CM | POA: Diagnosis not present

## 2023-12-02 DIAGNOSIS — N5089 Other specified disorders of the male genital organs: Secondary | ICD-10-CM

## 2023-12-02 DIAGNOSIS — B0231 Zoster conjunctivitis: Secondary | ICD-10-CM

## 2023-12-02 MED ORDER — GABAPENTIN 300 MG PO CAPS: 300.0000 mg | ORAL_CAPSULE | Freq: Every day | ORAL | 0 refills | Status: AC

## 2023-12-02 NOTE — Telephone Encounter (Signed)
 FYI Only or Action Required?: FYI only for provider.  Patient was last seen in primary care on 08/11/2023 by Edman Marsa PARAS, DO.  Called Nurse Triage reporting Eye Problem/ Scrotum Pain.  Symptoms began a week ago.  Interventions attempted: Prescription medications: Valtrex .  Symptoms are: gradually worsening.  Triage Disposition: See HCP Within 4 Hours (Or PCP Triage)  Patient/caregiver understands and will follow disposition?: Yes      Copied from CRM #[id number]. Topic: Clinical - Red Word Triage >> Dec 02, 2023  9:25 AM Mia F wrote: Red Word that prompted transfer to Nurse Triage: Possibly have shingles in his eye. Experiencing eye dryness, little of blurriness, and vision not as clear. May also have it in is scrotum as well. It is burning to where he almost passed out from the pain. Skin is slightly blue. Has been going on for a little over a week. Went to UC and they believes it is shingles as well and gave pt valACYclovir  (VALTREX ) 1000 MG tablet. Medication does not seem to be helping  much Reason for Disposition  [1] Painful rash near eye AND [2] multiple small blisters grouped together  Answer Assessment - Initial Assessment Questions Dryness in eye. Pt currently on Valtrex  and eye drops for symptoms. Burning sensation in scrotum area for a few days. Pt states he can feel a few small bumps in the area. Patient denies pain with urination but states the pain is so bad he almost passed out.   1. ONSET: When did the pain start? (e.g., minutes, hours, days)     Last week  2. TIMING: Does the pain come and go, or has it been constant since it started? (e.g., constant, intermittent, fleeting)     Comes and goes  3. SEVERITY: How bad is the pain?  (Scale 1-10; mild, moderate or severe)     7/10 4. LOCATION: Where does it hurt?  (e.g., eyelid, eye, cheekbone)     R Eye and cheek area   5. CAUSE: What do you think is causing the pain?     Shingles  6. VISION:  Do you have blurred vision or changes in your vision?      Blurry vision out of R Eye  7. EYE DISCHARGE: Is there any discharge (pus) from the eye(s)?  If Yes, ask: What color is it?      Gray/  white discharge  8. FEVER: Do you have a fever? If Yes, ask: What is it, how was it measured, and when did it start?      Felt like he may have symptoms  9. OTHER SYMPTOMS: Do you have any other symptoms? (e.g., headache, nasal discharge, facial rash)     Mild headaches  Protocols used: Eye Pain and Other Symptoms-A-AH

## 2023-12-02 NOTE — Progress Notes (Signed)
 Subjective:    Patient ID: Leonard Lee, male    DOB: 1980/09/23, 43 y.o.   MRN: [id number]  Leonard Lee is a 43 y.o. male presenting on 12/02/2023 for Rash (Shingles flare R face / eye)  Patient presents for a same day appointment.  HPI  Discussed the use of AI scribe software for clinical note transcription with the patient, who gave verbal consent to proceed.  History of Present Illness   Leonard Lee is a 43 year old male who presents with shingles affecting the face and eye.  Herpetic facial and ocular symptoms Onset 1 week ago - Vesicular lesions developed on the right cheek, initially appearing as three pimple-like bumps that became red, irritated, swollen, and subsequently scabbed over. - Right eye erythema present, with others commenting on the redness. - Headaches associated with facial and ocular symptoms. - Diagnosed with shingles at the [facility] on November 29, 2023. Treated with Valtrex  course, he had fluorescein dye test and evaluation at that time. Next day 11/30/23 - Ophthalmologic evaluation at [facility] revealed cloudiness behind the lens. - Follow-up appointment scheduled at the eye center for December 03, 2023.  Genital cutaneous symptoms - Irritation and burning sensation in the under scrotum area began on November 30, 2023. - Painful and irritating sensation, significantly improved with application of hemorrhoidal cream. - Resolved now      12/02/2023   10:59 AM 12/02/2023   10:58 AM 08/11/2023   10:54 AM  Depression screen PHQ 2/9  Decreased Interest 0 0 0  Down, Depressed, Hopeless   0  PHQ - 2 Score 0 0 0  Altered sleeping 0  0  Tired, decreased energy 0  0  Change in appetite 0  0  Feeling bad or failure about yourself  0  0  Trouble concentrating 2  1  Moving slowly or fidgety/restless 0  0  Suicidal thoughts 0  0  PHQ-9 Score 2  1  Difficult doing work/chores   Not difficult at all       12/02/2023   10:59 AM 08/11/2023   10:54 AM  12/15/2022    9:01 AM 05/16/2021   10:00 AM  GAD 7 : Generalized Anxiety Score  Nervous, Anxious, on Edge 1 0 1 1  Control/stop worrying 1 0 1 1  Worry too much - different things  0 1 1  Trouble relaxing 2 0 1 2  Restless  0 1 0  Easily annoyed or irritable  1 2 2   Afraid - awful might happen  0 1 2  Total GAD 7 Score  1 8 9   Anxiety Difficulty  Not difficult at all  Very difficult    Social History   Tobacco Use   Smoking status: Former    Current packs/day: 0.00    Types: Cigarettes    Quit date: 03/23/2022    Years since quitting: 1.6   Smokeless tobacco: Never  Vaping Use   Vaping status: Former  Substance Use Topics   Alcohol use: Not Currently    Comment: Occ.   Drug use: Not Currently    Review of Systems Per HPI unless specifically indicated above     Objective:    BP (!) 144/86 (BP Location: Left Arm, Cuff Size: Normal)   Pulse 77   Ht 6' 2 (1.88 m)   Wt (!) 330 lb (149.7 kg)   SpO2 98%   BMI 42.37 kg/m   Wt Readings from Last 3 Encounters:  12/02/23 (!) 330 lb (149.7 kg)  08/11/23 (!) 331 lb (150.1 kg)  12/15/22 (!) 390 lb (176.9 kg)    Physical Exam Vitals and nursing note reviewed.  Constitutional:      General: He is not in acute distress.    Appearance: Normal appearance. He is well-developed. He is not diaphoretic.     Comments: Well-appearing, comfortable, cooperative  HENT:     Head: Normocephalic and atraumatic.  Eyes:     General:        Right eye: No discharge.        Left eye: No discharge.     Extraocular Movements: Extraocular movements intact.     Comments: R eye with conjunctival injection redness.  Cardiovascular:     Rate and Rhythm: Normal rate.  Pulmonary:     Effort: Pulmonary effort is normal.  Genitourinary:    Comments: External male genital exam. Scrotum appears normal without rash or lesions. Non tender, no edema or swelling. Skin:    General: Skin is warm and dry.     Findings: Rash (R facial cheek near lateral  R eyelid, vesicular rash maculopapular now healing dried up and improving.) present. No erythema.  Neurological:     Mental Status: He is alert and oriented to person, place, and time.  Psychiatric:        Mood and Affect: Mood normal.        Behavior: Behavior normal.        Thought Content: Thought content normal.     Comments: Well groomed, good eye contact, normal speech and thoughts     Right Face / Eye   Results for orders placed or performed in visit on 06/13/21  BASIC METABOLIC PANEL WITH GFR   Collection Time: 06/13/21  8:42 AM  Result Value Ref Range   Glucose, Bld 95 65 - 139 mg/dL   BUN 17 7 - 25 mg/dL   Creat 8.97 9.39 - 8.70 mg/dL   eGFR 95 > OR = 60 fO/fpw/8.26f7   BUN/Creatinine Ratio NOT APPLICABLE 6 - 22 (calc)   Sodium 138 135 - 146 mmol/L   Potassium 4.0 3.5 - 5.3 mmol/L   Chloride 106 98 - 110 mmol/L   CO2 23 20 - 32 mmol/L   Calcium 9.1 8.6 - 10.3 mg/dL  Testosterone    Collection Time: 06/13/21  8:42 AM  Result Value Ref Range   Testosterone  542 250 - 827 ng/dL      Assessment & Plan:   Problem List Items Addressed This Visit   None Visit Diagnoses       Herpes zoster conjunctivitis    -  Primary   Relevant Medications   gabapentin  (NEURONTIN ) 300 MG capsule     Post herpetic neuralgia       Relevant Medications   gabapentin  (NEURONTIN ) 300 MG capsule     Scrotal swelling            Herpes zoster ophthalmicus with postherpetic neuralgia Herpes zoster ophthalmicus affecting right eye and face, diagnosed one week ago. Symptoms include facial scabbing, eye redness, and lens cloudiness. Postherpetic neuralgia causing sharp periorbital pain. Condition improving with treatment. Discussed potential duration of neuralgia and advised caution around immunocompromised, pregnant, and elderly individuals until rash resolves.  Reviewed Urgent Care visit from 11/29/23 and also seen by [facility] next day on 11/30/23 We discussed the  importance of this diagnosis and urgency for any visual concerns. He has follow-up already with Ophthalmologist tomorrow on  Friday 9/12 to monitor progress  - Continue Valtrex  course already prescribed - Use Systane and cool compresses for eye. - Follow-up with [facility] on Friday. - New rx Gabapentin  300 mg at bedtime for nerve pain, post herpetic neuralgia, he has old rx 100mg  dose before - Avoid contact with vulnerable individuals until rash resolves.  Discussed shingles vaccine post-resolution of current episode. Vaccine typically for those over 50, exception possible due to current episode. - Consider shingles vaccination six weeks post-resolution, pending insurance and pharmacy approval.      Additional topic  Scrotal Inflammation / Swelling / Dermatitis Recent onset with 9/9 having some scrotal discomfort swelling vs inflammation. No rash or lesions. No significant pain. Seems to be resolved now. He was worried mostly if it was related to shingles, but history and exam does not support this Reassurance given today.   No orders of the defined types were placed in this encounter.   Meds ordered this encounter  Medications   gabapentin  (NEURONTIN ) 300 MG capsule    Sig: Take 1 capsule (300 mg total) by mouth at bedtime.    Dispense:  90 capsule    Refill:  0    Follow up plan: Return if symptoms worsen or fail to improve.   Marsa Officer, DO [facility] 12/02/2023, 10:36 AM

## 2023-12-02 NOTE — Patient Instructions (Signed)
Thank you for coming to the office today.  Start the anti-viral medication - Valtrex take one 3 times a day for 7 days to complete the entire course. This will help the current flare up heal, including the rash to dry up and resolve quicker. It may take several days to weeks for the rash to completely resolve. Typically it will dry up and scab off.  Blister and Rash Care Take a cool bath or apply cool compresses to the area of the rash or blisters as directed by your health care provider. This may help with pain and itching. Keep your rash covered with a loose bandage (dressing). Wear loose-fitting clothing to help ease the pain of material rubbing against the rash. Keep your rash and blisters clean with mild soap and cool water or as directed by your health care provider. Check your rash every day for signs of infection. These include redness, swelling, and pain that lasts or increases. Do not pick your blisters. Do not scratch your rash.  As mentioned, some patients experience "Post-Herpetic Neuralgia" or a persistent sensation or feeling of "nerve irritation or pain" that can last for days to weeks to months or longer in this same area.  You may try the nerve medicine to help ease these symptoms now or in the future if it is needed. Try Gabapentin 100mg capsules, take at night for 2-3 nights only, and then increase to 2 times a day for a few days, and then may increase to 3 times a day, it may make you drowsy, if helps significantly at night only, then you can increase instead to 3 capsules at night, instead of 3 times a day - In the future if needed, we can significantly increase the dose if tolerated well, some common doses are 300mg three times a day up to 600mg three times a day, usually it takes several weeks or months to get to higher doses  It is contagious to patients who have never had Chicken Pox - or someone who is pregnant (unborn baby is at risk) - or elderly with weakened immune  system. Try to avoid direct close contact with others on the skin if you have active blisters. Once the rash dries up and heals, it is less of a concern.  You may get the Shingles vaccine (Shingrix) approximately 6 weeks after the rash and episode is resolved. It is a two dose series of vaccines. You may get this at the pharmacy, as Medicare does not cover this vaccine at our office.   Contact your doctor if: Your pain is not relieved with prescribed medicines. Your pain does not get better after the rash heals. Your rash looks infected. Signs of infection include redness, swelling, and pain that lasts or increases.  Please seek more immediate medical attention at the Hospital Emergency Department IF The rash is on your face or nose. You have facial pain, pain around your eye area, or loss of feeling on one side of your face. You have ear pain or you have ringing in your ear. You have loss of taste. Your condition gets worse.    ------------------------------------------------------------------------------------------------------- Please review the additional information below about Shingles, if you have additional concerns.  Shingles Shingles, which is also known as herpes zoster, is an infection that causes a painful skin rash and fluid-filled blisters. Shingles is not related to genital herpes, which is a sexually transmitted infection. Shingles only develops in people who: Have had chickenpox. Have received the chickenpox vaccine. (  This is rare.)  What are the causes? Shingles is caused by varicella-zoster virus (VZV). This is the same virus that causes chickenpox. After exposure to VZV, the virus stays in the body in an inactive (dormant) state. Shingles develops if the virus reactivates. This can happen many years after the initial exposure to VZV. It is not known what causes this virus to reactivate. What increases the risk? People who have had chickenpox or received the  chickenpox vaccine are at risk for shingles. Infection is more common in people who: Are older than age 50. Have a weakened defense (immune) system, such as those with HIV, AIDS, or cancer. Are taking medicines that weaken the immune system, such as transplant medicines. Are under great stress.  What are the signs or symptoms? Early symptoms of this condition include itching, tingling, and pain in an area on your skin. Pain may be described as burning, stabbing, or throbbing. A few days or weeks after symptoms start, a painful red rash appears, usually on one side of the body in a bandlike or beltlike pattern. The rash eventually turns into fluid-filled blisters that break open, scab over, and dry up in about 2-3 weeks. At any time during the infection, you may also develop: A fever. Chills. A headache. An upset stomach.  How is this diagnosed? This condition is diagnosed with a skin exam. Sometimes, skin or fluid samples are taken from the blisters before a diagnosis is made. These samples are examined under a microscope or sent to a lab for testing. How is this treated? There is no specific cure for this condition. Your health care provider will probably prescribe medicines to help you manage pain, recover more quickly, and avoid long-term problems. Medicines may include: Antiviral drugs. Anti-inflammatory drugs. Pain medicines.  If the area involved is on your face, you may be referred to a specialist, such as an eye doctor (ophthalmologist) or an ear, nose, and throat (ENT) doctor to help you avoid eye problems, chronic pain, or disability. Follow these instructions at home: Medicines Take medicines only as directed by your health care provider. Apply an anti-itch or numbing cream to the affected area as directed by your health care provider. General instructions Rest as directed by your health care provider. Keep all follow-up visits as directed by your health care provider. This  is important. Until your blisters scab over, your infection can cause chickenpox in people who have never had it or been vaccinated against it. To prevent this from happening, avoid contact with other people, especially: Babies. Pregnant women. Children who have eczema. Elderly people who have transplants. People who have chronic illnesses, such as leukemia or AIDS. This information is not intended to replace advice given to you by your health care provider. Make sure you discuss any questions you have with your health care provider. Document Released: 03/09/2005 Document Revised: 11/03/2015 Document Reviewed: 01/18/2014 Elsevier Interactive Patient Education  2018 Elsevier [employer].    Please schedule a Follow-up Appointment to: Return if symptoms worsen or fail to improve.  If you have any other questions or concerns, please feel free to call the office or send a message through MyChart. You may also schedule an earlier appointment if necessary.  Additionally, you may be receiving a survey about your experience at our office within a few days to 1 week by e-mail or mail. We value your feedback.  Jacinto Keil, DO [facility], [facility] 

## 2023-12-03 DIAGNOSIS — B0233 Zoster keratitis: Secondary | ICD-10-CM | POA: Diagnosis not present

## 2023-12-03 DIAGNOSIS — Z9889 Other specified postprocedural states: Secondary | ICD-10-CM | POA: Diagnosis not present

## 2023-12-03 DIAGNOSIS — H269 Unspecified cataract: Secondary | ICD-10-CM | POA: Diagnosis not present

## 2023-12-03 DIAGNOSIS — H16002 Unspecified corneal ulcer, left eye: Secondary | ICD-10-CM | POA: Diagnosis not present

## 2023-12-07 DIAGNOSIS — H269 Unspecified cataract: Secondary | ICD-10-CM | POA: Diagnosis not present

## 2023-12-07 DIAGNOSIS — B0233 Zoster keratitis: Secondary | ICD-10-CM | POA: Diagnosis not present

## 2023-12-07 DIAGNOSIS — H16002 Unspecified corneal ulcer, left eye: Secondary | ICD-10-CM | POA: Diagnosis not present

## 2023-12-07 DIAGNOSIS — Z9889 Other specified postprocedural states: Secondary | ICD-10-CM | POA: Diagnosis not present

## 2023-12-10 DIAGNOSIS — Z9889 Other specified postprocedural states: Secondary | ICD-10-CM | POA: Diagnosis not present

## 2023-12-10 DIAGNOSIS — H269 Unspecified cataract: Secondary | ICD-10-CM | POA: Diagnosis not present

## 2023-12-10 DIAGNOSIS — B0233 Zoster keratitis: Secondary | ICD-10-CM | POA: Diagnosis not present

## 2023-12-10 DIAGNOSIS — H16002 Unspecified corneal ulcer, left eye: Secondary | ICD-10-CM | POA: Diagnosis not present

## 2023-12-14 ENCOUNTER — Ambulatory Visit: Payer: Self-pay

## 2023-12-14 DIAGNOSIS — R5383 Other fatigue: Secondary | ICD-10-CM | POA: Diagnosis not present

## 2023-12-14 DIAGNOSIS — R42 Dizziness and giddiness: Secondary | ICD-10-CM | POA: Diagnosis not present

## 2023-12-14 DIAGNOSIS — Z79899 Other long term (current) drug therapy: Secondary | ICD-10-CM | POA: Diagnosis not present

## 2023-12-14 DIAGNOSIS — I10 Essential (primary) hypertension: Secondary | ICD-10-CM | POA: Diagnosis not present

## 2023-12-14 DIAGNOSIS — R519 Headache, unspecified: Secondary | ICD-10-CM | POA: Diagnosis not present

## 2023-12-14 DIAGNOSIS — F17210 Nicotine dependence, cigarettes, uncomplicated: Secondary | ICD-10-CM | POA: Diagnosis not present

## 2023-12-14 DIAGNOSIS — Z20822 Contact with and (suspected) exposure to covid-19: Secondary | ICD-10-CM | POA: Diagnosis not present

## 2023-12-14 NOTE — Telephone Encounter (Signed)
 FYI Only or Action Required?: FYI only for provider.  Patient was last seen in primary care on 12/02/2023 by Edman Marsa PARAS, DO.  Called Nurse Triage reporting Headache.  Symptoms began a couple of weeks ago.  Symptoms are: gradually worsening.  Triage Disposition: Go to ED Now (Notify PCP)  Patient/caregiver understands and will follow disposition?: Yes       Copied from CRM [id number]. Topic: Clinical - Red Word Triage >> Dec 14, 2023  4:32 PM DeAngela L wrote: Red Word that prompted transfer to Nurse Triage: patient have head that are debilitating since he got shingles he has been having the extreme headache pain and now the headaches are causing dizziness and feels like he is going to pass out like he is going down an elevator really fast, patient states his balance is off a little when he is experiencing this headache   Pt num [phone] (M)        Reason for Disposition  [1] SEVERE headache (e.g., excruciating) AND [2] worst headache of life  Answer Assessment - Initial Assessment Questions 1. LOCATION: Where does it hurt?      Center of head  2. ONSET: When did the headache start? (e.g., minutes, hours, days)      Approximately 2 weeks ago  3. PATTERN: Does the pain come and go, or has it been constant since it started?     Intermittent  4. SEVERITY: How bad is the pain? and What does it keep you from doing?  (e.g., Scale 1-10; mild, moderate, or severe)     10/10 5. RECURRENT SYMPTOM: Have you ever had headaches before? If Yes, ask: When was the last time? and What happened that time?      Has not had headaches like this before  6. CAUSE: What do you think is causing the headache?     Has been having them since having shingles in his eye 7. MIGRAINE: Have you been diagnosed with migraine headaches? If Yes, ask: Is this headache similar?      No 8. HEAD INJURY: Has there been any recent injury to your head?      No 9. OTHER  SYMPTOMS: Do you have any other symptoms? (e.g., fever, stiff neck, eye pain, sore throat, cold symptoms)     Dizziness  Protocols used: Headache-A-AH

## 2023-12-15 NOTE — Telephone Encounter (Signed)
 Spoke with patient, he is still having headaches and ringing in his ears. He went to [facility] yesterday he was not happy with results from there. I advised him to go to an ED today maybe a different one then yesterday

## 2023-12-15 NOTE — Telephone Encounter (Signed)
 Just for documentation purposes today. No further need to call him back.  I reviewed his last note from urgent care 11/29/23 he had BP 200/100+ and when I saw him he still had elevated BP 140s+ on 9/11  It may be severe elevated BP as well.  If I see him soon in follow-up after they can get him back on track, then we can discuss BP meds and referral to Neurology  Marsa Officer, DO [facility] 12/15/2023, 10:06 AM

## 2023-12-15 NOTE — Telephone Encounter (Signed)
 For sudden severe debilitating headache that he cannot function and has neurological symptoms, ED is best option.  He will probably need a Neurologist referral after ED.  Marsa Officer, DO [facility] 12/15/2023, 8:31 AM

## 2023-12-16 DIAGNOSIS — H269 Unspecified cataract: Secondary | ICD-10-CM | POA: Diagnosis not present

## 2023-12-16 DIAGNOSIS — H16002 Unspecified corneal ulcer, left eye: Secondary | ICD-10-CM | POA: Diagnosis not present

## 2023-12-16 DIAGNOSIS — Z9889 Other specified postprocedural states: Secondary | ICD-10-CM | POA: Diagnosis not present

## 2023-12-16 DIAGNOSIS — B0233 Zoster keratitis: Secondary | ICD-10-CM | POA: Diagnosis not present

## 2023-12-20 ENCOUNTER — Inpatient Hospital Stay: Admitting: Family Medicine

## 2023-12-21 ENCOUNTER — Ambulatory Visit: Admitting: Family Medicine

## 2023-12-21 ENCOUNTER — Encounter: Payer: Self-pay | Admitting: Family Medicine

## 2023-12-21 ENCOUNTER — Other Ambulatory Visit: Payer: Self-pay | Admitting: Family Medicine

## 2023-12-21 VITALS — BP 148/74 | HR 87 | Ht 74.0 in | Wt 329.8 lb

## 2023-12-21 DIAGNOSIS — I10 Essential (primary) hypertension: Secondary | ICD-10-CM

## 2023-12-21 DIAGNOSIS — B0229 Other postherpetic nervous system involvement: Secondary | ICD-10-CM | POA: Diagnosis not present

## 2023-12-21 DIAGNOSIS — R42 Dizziness and giddiness: Secondary | ICD-10-CM | POA: Diagnosis not present

## 2023-12-21 DIAGNOSIS — F419 Anxiety disorder, unspecified: Secondary | ICD-10-CM

## 2023-12-21 DIAGNOSIS — R519 Headache, unspecified: Secondary | ICD-10-CM

## 2023-12-21 MED ORDER — CHLORTHALIDONE 25 MG PO TABS: 25.0000 mg | ORAL_TABLET | Freq: Every day | ORAL | 0 refills | Status: AC

## 2023-12-21 MED ORDER — BUSPIRONE HCL 5 MG PO TABS: 5.0000 mg | ORAL_TABLET | Freq: Three times a day (TID) | ORAL | 0 refills | Status: AC | PRN

## 2023-12-21 MED ORDER — MECLIZINE HCL 25 MG PO TABS: 25.0000 mg | ORAL_TABLET | Freq: Three times a day (TID) | ORAL | 0 refills | Status: AC | PRN

## 2023-12-21 NOTE — Progress Notes (Signed)
 Subjective:    Patient ID: Leonard Lee, male    DOB: 1980-09-07, 43 y.o.   MRN: [id number]  Leonard Lee is a 43 y.o. male presenting on 12/21/2023 for Follow-up (Concerned about the shingle virus. Patient states he is still having headaches. He has a mild headache as we speak.  Patients states Headaches are everyday.)   HPI  Discussed the use of AI scribe software for clinical note transcription with the patient, who gave verbal consent to proceed.  History of Present Illness   Leonard Lee is a 43 year old male with a history of shingles who presents with persistent headaches and dizziness.  Timeline 11/29/23 acute shingles R side eye / face, went to urgent Care and this was treated, also saw Eye Doctor 12/02/23 saw me for shingles follow-up, we continued therapy for post herpetic neuralgia and pain 12/14/23 he went to [facility] ED for persistent headache and constellation of symptoms with dizziness episodes, after ruled out acute or life threatening etiologies  12/21/23 today follow-up after ED  Cephalalgia and dizziness - Persistent headaches with a different character than prior episodes - Associated dizziness, particularly when looking up from a screen - Experiences a 'wave of dizzy' and lightheadedness, especially during a CT scan - Lightheadedness localized to the center of the head - Craving for ice cream, which seems to alleviate symptoms, possibly due to its cooling effect  Auditory and sensory disturbances - Constant buzzing sensation similar to tinnitus - No associated ear symptoms  Ophthalmologic symptoms - History of shingles affecting the eye, diagnosed November 29, 2023 - Redness in the left eye, attributed to contact lens use - Improvement in prior ulceration and cataract per ophthalmology evaluation - Concern for changes in the texture of the forehead skin, perceived as a residual pattern from shingles  Blood pressure fluctuations - Currently taking amlodipine ,  olmesartan , and hydrochlorothiazide  for hypertension - Difficulty maintaining consistent blood pressure levels, with fluctuations that do not stabilize at lower levels  Anxiety and neurological concerns - Concern for potential ongoing neurological effects from shingles - Family history of anxiety - Personal experience of anxiety, believed to contribute to current symptoms       12/21/2023    3:55 PM 12/02/2023   10:59 AM 12/02/2023   10:58 AM  Depression screen PHQ 2/9  Decreased Interest 0 0 0  Down, Depressed, Hopeless 0    PHQ - 2 Score 0 0 0  Altered sleeping 0 0   Tired, decreased energy 1 0   Change in appetite 0 0   Feeling bad or failure about yourself  0 0   Trouble concentrating 0 2   Moving slowly or fidgety/restless 0 0   Suicidal thoughts 0 0   PHQ-9 Score 1 2        12/21/2023    3:56 PM 12/02/2023   10:59 AM 08/11/2023   10:54 AM 12/15/2022    9:01 AM  GAD 7 : Generalized Anxiety Score  Nervous, Anxious, on Edge 0 1 0 1  Control/stop worrying 0 1 0 1  Worry too much - different things 0  0 1  Trouble relaxing 0 2 0 1  Restless 0  0 1  Easily annoyed or irritable 0  1 2  Afraid - awful might happen 0  0 1  Total GAD 7 Score 0  1 8  Anxiety Difficulty Not difficult at all  Not difficult at all     Social History   Tobacco  Use   Smoking status: Former    Current packs/day: 0.00    Types: Cigarettes    Quit date: 03/23/2022    Years since quitting: 1.7   Smokeless tobacco: Never  Vaping Use   Vaping status: Former  Substance Use Topics   Alcohol use: Not Currently    Comment: Occ.   Drug use: Not Currently    Review of Systems Per HPI unless specifically indicated above     Objective:    BP (!) 148/74 (BP Location: Left Arm, Cuff Size: Large)   Pulse 87   Ht 6' 2 (1.88 m)   Wt (!) 329 lb 12.8 oz (149.6 kg)   SpO2 96%   BMI 42.34 kg/m   Wt Readings from Last 3 Encounters:  12/21/23 (!) 329 lb 12.8 oz (149.6 kg)  12/02/23 (!) 330 lb  (149.7 kg)  08/11/23 (!) 331 lb (150.1 kg)    Physical Exam Vitals and nursing note reviewed.  Constitutional:      General: He is not in acute distress.    Appearance: He is well-developed. He is not diaphoretic.     Comments: Well-appearing, comfortable, cooperative  HENT:     Head: Normocephalic and atraumatic.  Eyes:     General:        Right eye: No discharge.        Left eye: No discharge.     Extraocular Movements: Extraocular movements intact.     Pupils: Pupils are equal, round, and reactive to light.     Comments: Left eye has conjunctival injection R eye is clear  Neck:     Thyroid: No thyromegaly.  Cardiovascular:     Rate and Rhythm: Normal rate and regular rhythm.     Pulses: Normal pulses.     Heart sounds: Normal heart sounds. No murmur heard. Pulmonary:     Effort: Pulmonary effort is normal. No respiratory distress.     Breath sounds: Normal breath sounds. No wheezing or rales.  Musculoskeletal:        General: Normal range of motion.     Cervical back: Normal range of motion and neck supple.  Lymphadenopathy:     Cervical: No cervical adenopathy.  Skin:    General: Skin is warm and dry.     Findings: Rash (residual rash on R side of face from shingles, healed now but has residual rash) present. No erythema.  Neurological:     Mental Status: He is alert and oriented to person, place, and time. Mental status is at baseline.  Psychiatric:        Behavior: Behavior normal.     Comments: Well groomed, good eye contact, normal speech and thoughts      I have personally reviewed the radiology report from 12/14/23 at [facility].   CT head WO contrast  Anatomical Region Laterality Modality  Head -- Computed Tomography   Impression  No acute intracranial abnormality. Narrative  This result has an attachment that is not available. EXAM: Computed tomography, head or brain without contrast material. ACCESSION: [id number] [facility]  CLINICAL INDICATION: 43  years old Male with headache x3 weeks    COMPARISON: None available  TECHNIQUE: Axial CT images of the head from skull base to vertex without contrast.  FINDINGS: No acute intracranial hemorrhage, extra-axial fluid collection, or territorial infarct. The ventricles are normal in caliber without midline shift. No mass lesion. Basal cisterns are patent.  The sinuses and mastoid air cells are unremarkable. Soft tissues and  osseous structures are unremarkable.  No intracranial hemorrhage or skull fractures. Procedure Note  Claudene Reyes Eck, MD - 12/14/2023 Formatting of this note might be different from the original. EXAM: Computed tomography, head or brain without contrast material. ACCESSION: [id number] [facility]  CLINICAL INDICATION: 43 years old Male with headache x3 weeks    COMPARISON: None available  TECHNIQUE: Axial CT images of the head from skull base to vertex without contrast.  FINDINGS: No acute intracranial hemorrhage, extra-axial fluid collection, or territorial infarct. The ventricles are normal in caliber without midline shift. No mass lesion. Basal cisterns are patent.  The sinuses and mastoid air cells are unremarkable. Soft tissues and osseous structures are unremarkable.  No intracranial hemorrhage or skull fractures.  IMPRESSION: No acute intracranial abnormality. Exam End: 12/14/23 21:26   Specimen Collected: 12/14/23 21:30 Last Resulted: 12/14/23 21:33  Received From: [facility]  Result Received: 12/20/23 13:00    Results for orders placed or performed in visit on 06/13/21  BASIC METABOLIC PANEL WITH GFR   Collection Time: 06/13/21  8:42 AM  Result Value Ref Range   Glucose, Bld 95 65 - 139 mg/dL   BUN 17 7 - 25 mg/dL   Creat 8.97 9.39 - 8.70 mg/dL   eGFR 95 > OR = 60 fO/fpw/8.26f7   BUN/Creatinine Ratio NOT APPLICABLE 6 - 22 (calc)   Sodium 138 135 - 146 mmol/L   Potassium 4.0 3.5 - 5.3 mmol/L   Chloride 106 98 - 110 mmol/L   CO2 23 20 - 32  mmol/L   Calcium 9.1 8.6 - 10.3 mg/dL  Testosterone    Collection Time: 06/13/21  8:42 AM  Result Value Ref Range   Testosterone  542 250 - 827 ng/dL      Assessment & Plan:   Problem List Items Addressed This Visit     Essential hypertension   Relevant Medications   chlorthalidone (HYGROTON) 25 MG tablet   Other Visit Diagnoses       Post herpetic neuralgia    -  Primary   Relevant Orders   Ambulatory referral to Neurology     Vertigo       Relevant Medications   meclizine (ANTIVERT) 25 MG tablet   Other Relevant Orders   Ambulatory referral to Neurology     Anxiety       Relevant Medications   busPIRone (BUSPAR) 5 MG tablet     Acute nonintractable headache, unspecified headache type       Relevant Orders   Ambulatory referral to Neurology        Postherpetic neuralgia of the head with vertigo and dizziness Residual symptoms from shingles affecting cranial nerves, causing dizziness, vertigo, and buzzing sensation. Symptoms align with postherpetic neuralgia or complications related to his shingles episode  ED Visit recently reviewed Head CT and work up. He may warrant further advanced imaging w/ Neuro such as MRI  - Urgent Refer to neurology for further evaluation and management. - Provide meclizine for dizziness and vertigo relief. - Instruct on home exercises for vertigo, three times daily until symptoms resolve. - Already on Gabapentin  for post herpetic neuralgia symptoms  Uncontrolled hypertension Blood pressure remains elevated despite amlodipine , olmesartan , and hydrochlorothiazide  max doses. Severe BP elevated recently with acute shingles flare, but seems to have chronic uncontrolled BP Question OSA underlying however he has improved breathing and sleep with weight loss, I advised this still may be a factor  Pain and anxiety may contribute to variability. Plan to optimize control by adjusting  medications.  - Stop hydrochlorothiazide  25 mg daily. - Start  chlorthalidone 25 mg daily. - Monitor blood pressure for 7-10 days post-medication change and report readings. - Consider switching olmesartan  to valsartan if blood pressure remains uncontrolled.  Anxiety disorder Anxiety may elevate blood pressure and contribute to symptoms. He is interested in as-needed medication management. - Prescribe Buspar (buspirone) as needed, up to three times daily.      Strict return precautions given if symptoms severely worsen with headache, neurological symptoms advised when to seek care at hospital ED for symptoms if worsen   Route chart to [facility] urgent referral scheduling.  Orders Placed This Encounter  Procedures   Ambulatory referral to Neurology    Referral Priority:   Urgent    Referral Type:   Consultation    Referral Reason:   Specialty Services Required    Requested Specialty:   Neurology    Number of Visits Requested:   1    Meds ordered this encounter  Medications   chlorthalidone (HYGROTON) 25 MG tablet    Sig: Take 1 tablet (25 mg total) by mouth daily.    Dispense:  30 tablet    Refill:  0    Stop hydrochlorothiazide  25mg . Switch to chlorthalidone 25mg  daily   meclizine (ANTIVERT) 25 MG tablet    Sig: Take 1 tablet (25 mg total) by mouth 3 (three) times daily as needed for dizziness.    Dispense:  30 tablet    Refill:  0   busPIRone (BUSPAR) 5 MG tablet    Sig: Take 1 tablet (5 mg total) by mouth 3 (three) times daily as needed (anxiety).    Dispense:  90 tablet    Refill:  0    Follow up plan: Return if symptoms worsen or fail to improve.  Marsa Officer, DO [facility] 12/21/2023, 4:02 PM

## 2023-12-21 NOTE — Patient Instructions (Addendum)
 Thank you for coming to the office today.  Add Buspar for anxiety if need 5mg  one tab up to 3 times per day lasts 4-8 hours approximately this may help the BP  ---------------------  Referral to Neurology specialist in [place]. I am worried about Post-herpetic Neuralgia symptoms related to the shingles flare.  Your dizziness, vertigo, headache, can all be related.  Dr Onetha Epp (Headache specialist or one of her colleagues)  [facility]   Address: [address] [place], [place] [address] Hours: 8AM-5PM Phone: [phone]  ----------------------  I do think the BP elevations are part of the problem, it seems BP lately uncontrolled, could be due to the pain or other symptoms, or possibly the pain could be from the BP  Stop hydrochlorothiazide  25mg  Switch to new rx - Chlorthalidone 25mg  daily (this is the same category of med, but it is stronger)  AND OR  We can switch another one if you message or call in another 1-2 weeks if we need to make one more BP change  Next move we would stop Olmesartan  40mg  and then switch to Valsartan  -------------------------------------]  For Vertigo - To treat this, try the Epley Manuever (see diagrams/instructions below) at home up to 3 times a day for 1-2 weeks or until symptoms resolve - You may take Meclizine as needed up to 3 times a day for dizziness, this will not cure symptoms but may help. Caution may make you drowsy.  If you develop significant worsening episode with vertigo that does not improve and you get severe headache, loss of vision, arm or leg weakness, slurred speech, or other concerning symptoms please seek immediate medical attention at Emergency Department.  See the next page for images describing the Epley Manuever.     ----------------------------------------------------------------------------------------------------------------------        Please schedule a Follow-up Appointment to:  Return if symptoms worsen or fail to improve.  If you have any other questions or concerns, please feel free to call the office or send a message through MyChart. You may also schedule an earlier appointment if necessary.  Additionally, you may be receiving a survey about your experience at our office within a few days to 1 week by e-mail or mail. We value your feedback.  Marsa Officer, DO [facility], [place]

## 2023-12-23 ENCOUNTER — Encounter: Payer: Self-pay | Admitting: Neurology

## 2023-12-23 ENCOUNTER — Ambulatory Visit (INDEPENDENT_AMBULATORY_CARE_PROVIDER_SITE_OTHER): Admitting: Neurology

## 2023-12-23 VITALS — BP 146/82 | HR 83 | Ht 74.0 in | Wt 330.0 lb

## 2023-12-23 DIAGNOSIS — R42 Dizziness and giddiness: Secondary | ICD-10-CM | POA: Diagnosis not present

## 2023-12-23 DIAGNOSIS — B0230 Zoster ocular disease, unspecified: Secondary | ICD-10-CM | POA: Diagnosis not present

## 2023-12-23 DIAGNOSIS — R519 Headache, unspecified: Secondary | ICD-10-CM

## 2023-12-23 NOTE — Progress Notes (Signed)
 [facility]  PATIENT: Leonard Lee DOB: 1980-07-11  REQUESTING CLINICIAN: Edman Blunt * HISTORY FROM: Patient  REASON FOR VISIT: Post herpetic neuralgia/Vertigo/Headaches    HISTORICAL  CHIEF COMPLAINT:  Chief Complaint  Patient presents with   New Patient (Initial Visit)    Rm 12, shingles in the eye, veritgo    HISTORY OF PRESENT ILLNESS:  Discussed the use of AI scribe software for clinical note transcription with the patient, who gave verbal consent to proceed.  Leonard Lee is a 43 year old male with hypertension and anxiety who presents with symptoms following a shingles infection. He was referred by his primary care physician to the neurology department for further evaluation of potential complex issues related to the infection.  In early August, he was diagnosed with shingles at an urgent care facility and was initially treated with eye drops and antivirals taking three 1-gram tablets three times a day for seven days.  He experiences disorientation, lightheadedness, and dizziness, particularly when stressed or reading. He describes the sensation as a sudden worsening of his right sided pain. Additionally, he experiences a continuous humming noise, likened to a concussion noise, and lightheadedness at the top of his head.  He has balance issues and increased lightheadedness when taking gabapentin  300 mg, which he has reduced due to these side effects. He has 300 mg capsules but also has 100 mg tablets from a previous prescription.  He has undergone a CT scan at [facility] at the day of presentation which did not show any acute abnormalities. He expresses concern about the possibility of other underlying issues, as his symptoms were diagnosed based on pattern recognition without a blood test.    OTHER MEDICAL CONDITIONS: Hypertension, Anxiety, Obesity    REVIEW OF SYSTEMS: Full 14 system review of systems performed and negative with exception of: As  noted in the HPI   ALLERGIES: No Known Allergies  HOME MEDICATIONS: Outpatient Medications Prior to Visit  Medication Sig Dispense Refill   amLODipine  (NORVASC ) 10 MG tablet Take 1 tablet (10 mg total) by mouth daily. 30 tablet 1   busPIRone (BUSPAR) 5 MG tablet Take 1 tablet (5 mg total) by mouth 3 (three) times daily as needed (anxiety). 90 tablet 0   chlorthalidone (HYGROTON) 25 MG tablet Take 1 tablet (25 mg total) by mouth daily. 30 tablet 0   gabapentin  (NEURONTIN ) 300 MG capsule Take 1 capsule (300 mg total) by mouth at bedtime. 90 capsule 0   ibuprofen  (ADVIL ) 600 MG tablet Take 1 tablet (600 mg total) by mouth every 6 (six) hours as needed. 20 tablet 0   meclizine (ANTIVERT) 25 MG tablet Take 1 tablet (25 mg total) by mouth 3 (three) times daily as needed for dizziness. 30 tablet 0   olmesartan  (BENICAR ) 40 MG tablet Take 1 tablet (40 mg total) by mouth daily. 30 tablet 1   No facility-administered medications prior to visit.    PAST MEDICAL HISTORY: Past Medical History:  Diagnosis Date   Hypertension     PAST SURGICAL HISTORY: Past Surgical History:  Procedure Laterality Date   EYE SURGERY  1984   FINGER SURGERY  2004    FAMILY HISTORY: Family History  Problem Relation Age of Onset   Heart disease Father     SOCIAL HISTORY: Social History   Socioeconomic History   Marital status: Married    Spouse name: Not on file   Number of children: Not on file   Years of education: Not on file  Highest education level: GED or equivalent  Occupational History   Not on file  Tobacco Use   Smoking status: Former    Current packs/day: 0.00    Types: Cigarettes    Quit date: 03/23/2022    Years since quitting: 1.7   Smokeless tobacco: Never  Vaping Use   Vaping status: Former  Substance and Sexual Activity   Alcohol use: Not Currently    Comment: Occ.   Drug use: Not Currently   Sexual activity: Not on file  Other Topics Concern   Not on file  Social History  Narrative   Right handed   Caffeine-4 cups daily   Social Drivers of Health   Financial Resource Strain: Low Risk  (12/15/2022)   Overall Financial Resource Strain (CARDIA)    Difficulty of Paying Living Expenses: Not very hard  Food Insecurity: Food Insecurity Present (12/15/2022)   Hunger Vital Sign    Worried About Running Out of Food in the Last Year: Sometimes true    Ran Out of Food in the Last Year: Sometimes true  Transportation Needs: No Transportation Needs (12/15/2022)   PRAPARE - Administrator, Civil Service (Medical): No    Lack of Transportation (Non-Medical): No  Physical Activity: Insufficiently Active (12/15/2022)   Exercise Vital Sign    Days of Exercise per Week: 1 day    Minutes of Exercise per Session: 20 min  Stress: No Stress Concern Present (12/15/2022)   [employer] of Occupational Health - Occupational Stress Questionnaire    Feeling of Stress : Not at all  Social Connections: Socially Integrated (12/15/2022)   Social Connection and Isolation Panel    Frequency of Communication with Friends and Family: More than three times a week    Frequency of Social Gatherings with Friends and Family: More than three times a week    Attends Religious Services: More than 4 times per year    Active Member of Clubs or Organizations: Yes    Attends Engineer, structural: More than 4 times per year    Marital Status: Married  Catering manager Violence: Not on file    PHYSICAL EXAM  GENERAL EXAM/CONSTITUTIONAL: Vitals:  Vitals:   12/23/23 1443 12/23/23 1446  BP: (!) 150/84 (!) 146/82  Pulse: 83   Weight: (!) 330 lb (149.7 kg)   Height: 6' 2 (1.88 m)    Body mass index is 42.37 kg/m. Wt Readings from Last 3 Encounters:  12/23/23 (!) 330 lb (149.7 kg)  12/21/23 (!) 329 lb 12.8 oz (149.6 kg)  12/02/23 (!) 330 lb (149.7 kg)   Patient is in no distress; well developed, nourished and groomed; neck is supple  MUSCULOSKELETAL: Gait,  strength, tone, movements noted in Neurologic exam below  NEUROLOGIC: MENTAL STATUS:      No data to display         awake, alert, oriented to person, place and time recent and remote memory intact normal attention and concentration language fluent, comprehension intact, naming intact fund of knowledge appropriate  CRANIAL NERVE:  2nd, 3rd, 4th, 6th - Visual fields full to confrontation, extraocular muscles intact, no nystagmus 5th - facial sensation symmetric 7th - facial strength symmetric 8th - hearing intact 9th - palate elevates symmetrically, uvula midline 11th - shoulder shrug symmetric 12th - tongue protrusion midline  MOTOR:  normal bulk and tone, full strength in the BUE, BLE  SENSORY:  normal and symmetric to light touch  COORDINATION:  finger-nose-finger, fine finger movements normal  GAIT/STATION:  normal     DIAGNOSTIC DATA (LABS, IMAGING, TESTING) - I reviewed patient records, labs, notes, testing and imaging myself where available.  Lab Results  Component Value Date   WBC 6.9 04/22/2021   HGB 15.7 04/22/2021   HCT 46.1 04/22/2021   MCV 81.0 04/22/2021   PLT 206 04/22/2021      Component Value Date/Time   NA 138 06/13/2021 0842   K 4.0 06/13/2021 0842   CL 106 06/13/2021 0842   CO2 23 06/13/2021 0842   GLUCOSE 95 06/13/2021 0842   BUN 17 06/13/2021 0842   CREATININE 1.02 06/13/2021 0842   CALCIUM 9.1 06/13/2021 0842   PROT 7.9 04/22/2021 1347   ALBUMIN 4.7 04/22/2021 1347   AST 90 (H) 04/22/2021 1347   ALT 135 (H) 04/22/2021 1347   ALKPHOS 85 04/22/2021 1347   BILITOT 0.7 04/22/2021 1347   GFRNONAA >60 04/22/2021 1347   GFRAA >60 11/08/2019 1114   No results found for: CHOL, HDL, LDLCALC, LDLDIRECT, TRIG, CHOLHDL No results found for: YHAJ8R No results found for: VITAMINB12 No results found for: TSH   Head CT 12/14/2023 No acute intracranial abnormality.    ASSESSMENT AND PLAN  43 y.o. year old male with     Herpes zoster Ophthalmicus with postherpetic neuralgia Herpes zoster Ophthalmicus diagnosed in early August with ongoing postherpetic neuralgia. Experiencing pain and lightheadedness, particularly when stressed or reading. Gabapentin  was prescribed but causes increased lightheadedness and balance issues. Discussed that shingles can recur during stress or illness. - Reduce gabapentin  dosage to 100 mg if 300 mg causes side effects - Consider shingles vaccine in the future after active infection resolves  Dizziness and lightheadedness Reports dizziness and lightheadedness, particularly when stressed or reading. These symptoms are exacerbated by gabapentin . Likely related to postherpetic neuralgia and the side effects of gabapentin . - Reduce gabapentin  dosage to 100 mg if 300 mg causes side effects  Headache Experiencing headaches after recent infection. Due to ongoing complaints of dizziness, lightheadedness, and headaches, will obtain MRI of the brain with contrast will be ordered to rule out other potential causes and provide reassurance.  - Order MRI of the brain with contrast     1. Herpes zoster ophthalmicus   2. Vertigo   3. Nonintractable headache, unspecified chronicity pattern, unspecified headache type      Patient Instructions  MRI Brain with and without contrast  Trial of low dose Gabapentin  as needed for pain  Return if worse   Orders Placed This Encounter  Procedures   MR BRAIN W WO CONTRAST    No orders of the defined types were placed in this encounter.   Return if symptoms worsen or fail to improve.    Pastor Falling, MD 12/25/2023, 11:36 AM  [facility] [address], [address] [place], [place] [address] [phone]

## 2023-12-25 ENCOUNTER — Encounter: Payer: Self-pay | Admitting: Neurology

## 2023-12-25 NOTE — Patient Instructions (Addendum)
 MRI Brain with and without contrast  Trial of low dose Gabapentin  as needed for pain  Return if worse

## 2023-12-27 ENCOUNTER — Telehealth: Payer: Self-pay | Admitting: Neurology

## 2023-12-27 NOTE — Telephone Encounter (Signed)
 [employer] no auth required sent to GI for his weight. [phone]

## 2024-02-28 ENCOUNTER — Other Ambulatory Visit: Payer: Self-pay | Admitting: Family Medicine

## 2024-02-28 DIAGNOSIS — I10 Essential (primary) hypertension: Secondary | ICD-10-CM

## 2024-03-01 NOTE — Telephone Encounter (Signed)
 Requested medication (s) are due for refill today: yes  Requested medication (s) are on the active medication list: yes  Last refill:  11/29/23  Future visit scheduled: {no  Notes to clinic:  Unable to refill per protocol, last refill by another provider.      Requested Prescriptions  Pending Prescriptions Disp Refills   olmesartan  (BENICAR ) 40 MG tablet [Pharmacy Med Name: OLMESARTAN  MEDOXOMIL 40 MG TAB] 90 tablet 3    Sig: TAKE 1 TABLET BY MOUTH EVERY DAY     Cardiovascular:  Angiotensin Receptor Blockers Failed - 03/01/2024  9:48 AM      Failed - Cr in normal range and within 180 days    Creat  Date Value Ref Range Status  06/13/2021 1.02 0.60 - 1.29 mg/dL Final         Failed - K in normal range and within 180 days    Potassium  Date Value Ref Range Status  06/13/2021 4.0 3.5 - 5.3 mmol/L Final         Failed - Last BP in normal range    BP Readings from Last 1 Encounters:  12/23/23 (!) 146/82         Passed - Patient is not pregnant      Passed - Valid encounter within last 6 months    Recent Outpatient Visits           2 months ago Post herpetic neuralgia   [facility] [place], Marsa PARAS, DO   3 months ago Herpes zoster conjunctivitis   [place] [facility] [place], Marsa PARAS, DO   6 months ago Acute left-sided low back pain with left-sided sciatica   [facility] [place], Marsa PARAS, [place]
